# Patient Record
Sex: Male | Born: 1975 | Race: Black or African American | Hispanic: No | Smoking: Current every day smoker
Health system: Southern US, Community
[De-identification: ages and names within clinical notes are randomized; demographics above are authoritative.]

## PROBLEM LIST (undated history)

## (undated) DIAGNOSIS — R569 Unspecified convulsions: Secondary | ICD-10-CM

---

## 1998-09-21 ENCOUNTER — Emergency Department (HOSPITAL_COMMUNITY): Admission: EM | Admit: 1998-09-21 | Discharge: 1998-09-21 | Payer: Self-pay | Admitting: Emergency Medicine

## 1998-09-21 ENCOUNTER — Encounter: Payer: Self-pay | Admitting: Emergency Medicine

## 1998-12-11 ENCOUNTER — Emergency Department (HOSPITAL_COMMUNITY): Admission: EM | Admit: 1998-12-11 | Discharge: 1998-12-11 | Payer: Self-pay | Admitting: Emergency Medicine

## 1999-07-27 ENCOUNTER — Encounter: Payer: Self-pay | Admitting: Emergency Medicine

## 1999-07-27 ENCOUNTER — Emergency Department (HOSPITAL_COMMUNITY): Admission: EM | Admit: 1999-07-27 | Discharge: 1999-07-27 | Payer: Self-pay | Admitting: *Deleted

## 1999-09-23 ENCOUNTER — Emergency Department (HOSPITAL_COMMUNITY): Admission: EM | Admit: 1999-09-23 | Discharge: 1999-09-23 | Payer: Self-pay | Admitting: Emergency Medicine

## 1999-09-24 ENCOUNTER — Encounter: Payer: Self-pay | Admitting: Emergency Medicine

## 1999-09-24 ENCOUNTER — Ambulatory Visit (HOSPITAL_COMMUNITY): Admission: RE | Admit: 1999-09-24 | Discharge: 1999-09-24 | Payer: Self-pay | Admitting: Emergency Medicine

## 1999-09-27 ENCOUNTER — Emergency Department (HOSPITAL_COMMUNITY): Admission: EM | Admit: 1999-09-27 | Discharge: 1999-09-27 | Payer: Self-pay | Admitting: Emergency Medicine

## 1999-10-15 ENCOUNTER — Emergency Department (HOSPITAL_COMMUNITY): Admission: EM | Admit: 1999-10-15 | Discharge: 1999-10-16 | Payer: Self-pay | Admitting: Emergency Medicine

## 1999-10-23 ENCOUNTER — Emergency Department (HOSPITAL_COMMUNITY): Admission: EM | Admit: 1999-10-23 | Discharge: 1999-10-23 | Payer: Self-pay | Admitting: Emergency Medicine

## 1999-10-29 ENCOUNTER — Emergency Department (HOSPITAL_COMMUNITY): Admission: EM | Admit: 1999-10-29 | Discharge: 1999-10-29 | Payer: Self-pay | Admitting: *Deleted

## 1999-10-29 ENCOUNTER — Encounter: Payer: Self-pay | Admitting: Emergency Medicine

## 1999-11-04 ENCOUNTER — Emergency Department (HOSPITAL_COMMUNITY): Admission: EM | Admit: 1999-11-04 | Discharge: 1999-11-04 | Payer: Self-pay | Admitting: Emergency Medicine

## 1999-11-13 ENCOUNTER — Emergency Department (HOSPITAL_COMMUNITY): Admission: EM | Admit: 1999-11-13 | Discharge: 1999-11-13 | Payer: Self-pay | Admitting: Emergency Medicine

## 1999-11-13 ENCOUNTER — Encounter: Payer: Self-pay | Admitting: Emergency Medicine

## 1999-12-12 ENCOUNTER — Emergency Department (HOSPITAL_COMMUNITY): Admission: EM | Admit: 1999-12-12 | Discharge: 1999-12-12 | Payer: Self-pay | Admitting: Emergency Medicine

## 1999-12-12 ENCOUNTER — Encounter: Payer: Self-pay | Admitting: Emergency Medicine

## 2000-05-01 ENCOUNTER — Emergency Department (HOSPITAL_COMMUNITY): Admission: EM | Admit: 2000-05-01 | Discharge: 2000-05-01 | Payer: Self-pay | Admitting: *Deleted

## 2000-05-01 ENCOUNTER — Encounter: Payer: Self-pay | Admitting: *Deleted

## 2001-05-06 ENCOUNTER — Encounter: Payer: Self-pay | Admitting: Internal Medicine

## 2001-05-06 ENCOUNTER — Emergency Department (HOSPITAL_COMMUNITY): Admission: EM | Admit: 2001-05-06 | Discharge: 2001-05-06 | Payer: Self-pay | Admitting: Internal Medicine

## 2001-09-04 ENCOUNTER — Emergency Department (HOSPITAL_COMMUNITY): Admission: EM | Admit: 2001-09-04 | Discharge: 2001-09-04 | Payer: Self-pay | Admitting: Emergency Medicine

## 2001-09-08 ENCOUNTER — Emergency Department (HOSPITAL_COMMUNITY): Admission: EM | Admit: 2001-09-08 | Discharge: 2001-09-08 | Payer: Self-pay | Admitting: Emergency Medicine

## 2001-10-16 ENCOUNTER — Encounter: Payer: Self-pay | Admitting: Emergency Medicine

## 2001-10-16 ENCOUNTER — Emergency Department (HOSPITAL_COMMUNITY): Admission: EM | Admit: 2001-10-16 | Discharge: 2001-10-16 | Payer: Self-pay | Admitting: *Deleted

## 2001-11-10 ENCOUNTER — Emergency Department (HOSPITAL_COMMUNITY): Admission: EM | Admit: 2001-11-10 | Discharge: 2001-11-10 | Payer: Self-pay

## 2002-03-18 ENCOUNTER — Emergency Department (HOSPITAL_COMMUNITY): Admission: EM | Admit: 2002-03-18 | Discharge: 2002-03-18 | Payer: Self-pay

## 2002-04-30 ENCOUNTER — Emergency Department (HOSPITAL_COMMUNITY): Admission: EM | Admit: 2002-04-30 | Discharge: 2002-04-30 | Payer: Self-pay | Admitting: Emergency Medicine

## 2002-11-12 ENCOUNTER — Emergency Department (HOSPITAL_COMMUNITY): Admission: EM | Admit: 2002-11-12 | Discharge: 2002-11-12 | Payer: Self-pay | Admitting: *Deleted

## 2002-12-03 ENCOUNTER — Emergency Department (HOSPITAL_COMMUNITY): Admission: EM | Admit: 2002-12-03 | Discharge: 2002-12-03 | Payer: Self-pay | Admitting: Emergency Medicine

## 2003-06-19 ENCOUNTER — Emergency Department (HOSPITAL_COMMUNITY): Admission: EM | Admit: 2003-06-19 | Discharge: 2003-06-19 | Payer: Self-pay | Admitting: Emergency Medicine

## 2003-08-26 ENCOUNTER — Emergency Department (HOSPITAL_COMMUNITY): Admission: EM | Admit: 2003-08-26 | Discharge: 2003-08-26 | Payer: Self-pay | Admitting: Emergency Medicine

## 2003-10-27 ENCOUNTER — Emergency Department (HOSPITAL_COMMUNITY): Admission: AD | Admit: 2003-10-27 | Discharge: 2003-10-27 | Payer: Self-pay | Admitting: Family Medicine

## 2003-11-10 ENCOUNTER — Observation Stay (HOSPITAL_COMMUNITY): Admission: EM | Admit: 2003-11-10 | Discharge: 2003-11-10 | Payer: Self-pay | Admitting: Emergency Medicine

## 2003-11-22 ENCOUNTER — Emergency Department (HOSPITAL_COMMUNITY): Admission: EM | Admit: 2003-11-22 | Discharge: 2003-11-22 | Payer: Self-pay | Admitting: Emergency Medicine

## 2004-01-15 ENCOUNTER — Emergency Department (HOSPITAL_COMMUNITY): Admission: EM | Admit: 2004-01-15 | Discharge: 2004-01-16 | Payer: Self-pay | Admitting: Emergency Medicine

## 2004-01-15 ENCOUNTER — Emergency Department (HOSPITAL_COMMUNITY): Admission: EM | Admit: 2004-01-15 | Discharge: 2004-01-15 | Payer: Self-pay | Admitting: Family Medicine

## 2004-02-19 ENCOUNTER — Ambulatory Visit (HOSPITAL_COMMUNITY): Admission: RE | Admit: 2004-02-19 | Discharge: 2004-02-19 | Payer: Self-pay | Admitting: Gastroenterology

## 2004-02-19 ENCOUNTER — Encounter: Payer: Self-pay | Admitting: Gastroenterology

## 2004-04-14 ENCOUNTER — Emergency Department (HOSPITAL_COMMUNITY): Admission: EM | Admit: 2004-04-14 | Discharge: 2004-04-14 | Payer: Self-pay | Admitting: Emergency Medicine

## 2004-05-06 ENCOUNTER — Encounter: Payer: Self-pay | Admitting: Gastroenterology

## 2004-06-23 ENCOUNTER — Emergency Department (HOSPITAL_COMMUNITY): Admission: EM | Admit: 2004-06-23 | Discharge: 2004-06-23 | Payer: Self-pay | Admitting: Family Medicine

## 2005-03-24 ENCOUNTER — Emergency Department (HOSPITAL_COMMUNITY): Admission: EM | Admit: 2005-03-24 | Discharge: 2005-03-24 | Payer: Self-pay | Admitting: Emergency Medicine

## 2006-08-11 ENCOUNTER — Emergency Department (HOSPITAL_COMMUNITY): Admission: EM | Admit: 2006-08-11 | Discharge: 2006-08-11 | Payer: Self-pay | Admitting: Emergency Medicine

## 2006-10-08 ENCOUNTER — Emergency Department (HOSPITAL_COMMUNITY): Admission: EM | Admit: 2006-10-08 | Discharge: 2006-10-08 | Payer: Self-pay | Admitting: Emergency Medicine

## 2006-12-12 ENCOUNTER — Emergency Department (HOSPITAL_COMMUNITY): Admission: EM | Admit: 2006-12-12 | Discharge: 2006-12-12 | Payer: Self-pay | Admitting: Emergency Medicine

## 2007-02-24 ENCOUNTER — Emergency Department (HOSPITAL_COMMUNITY): Admission: EM | Admit: 2007-02-24 | Discharge: 2007-02-24 | Payer: Self-pay | Admitting: Emergency Medicine

## 2007-02-27 ENCOUNTER — Emergency Department (HOSPITAL_COMMUNITY): Admission: EM | Admit: 2007-02-27 | Discharge: 2007-02-27 | Payer: Self-pay | Admitting: *Deleted

## 2007-06-06 ENCOUNTER — Emergency Department (HOSPITAL_COMMUNITY): Admission: EM | Admit: 2007-06-06 | Discharge: 2007-06-06 | Payer: Self-pay | Admitting: Emergency Medicine

## 2008-08-24 ENCOUNTER — Emergency Department (HOSPITAL_COMMUNITY): Admission: EM | Admit: 2008-08-24 | Discharge: 2008-08-24 | Payer: Self-pay | Admitting: Emergency Medicine

## 2008-08-26 ENCOUNTER — Emergency Department (HOSPITAL_COMMUNITY): Admission: EM | Admit: 2008-08-26 | Discharge: 2008-08-26 | Payer: Self-pay | Admitting: Emergency Medicine

## 2008-08-26 ENCOUNTER — Encounter (INDEPENDENT_AMBULATORY_CARE_PROVIDER_SITE_OTHER): Payer: Self-pay | Admitting: *Deleted

## 2010-10-10 ENCOUNTER — Emergency Department (HOSPITAL_COMMUNITY)
Admission: EM | Admit: 2010-10-10 | Discharge: 2010-10-10 | Payer: Self-pay | Source: Home / Self Care | Admitting: Emergency Medicine

## 2010-10-10 ENCOUNTER — Encounter (INDEPENDENT_AMBULATORY_CARE_PROVIDER_SITE_OTHER): Payer: Self-pay | Admitting: *Deleted

## 2010-12-04 ENCOUNTER — Encounter (INDEPENDENT_AMBULATORY_CARE_PROVIDER_SITE_OTHER): Payer: Self-pay | Admitting: *Deleted

## 2010-12-11 NOTE — Letter (Signed)
Summary: New Patient letter  [facility]  [address]   [place], [place] [address]   Phone: [phone]  Fax: [phone]       12/04/2010 MRN: [id number]  Nicholas Medina [address] [place], [place]  [address]  Dear Mr. Nicholas Medina,  Welcome to the Gastroenterology Division at [facility].    You are scheduled to see Dr.  Russella Dar on 12/30/2010 at 10:15 on the 3rd floor at [facility], [address]  We ask that you try to arrive at our office 15 minutes prior to your appointment time to allow for check-in.  We would like you to complete the enclosed self-administered evaluation form prior to your visit and bring it with you on the day of your appointment.  We will review it with you.  Also, please bring a complete list of all your medications or, if you prefer, bring the medication bottles and we will list them.  Please bring your insurance card so that we may make a copy of it.  If your insurance requires a referral to see a specialist, please bring your referral form from your primary care physician.  Co-payments are due at the time of your visit and may be paid by cash, check or credit card.     Your office visit will consist of a consult with your physician (includes a physical exam), any laboratory testing he/she may order, scheduling of any necessary diagnostic testing (e.g. x-ray, ultrasound, CT-scan), and scheduling of a procedure (e.g. Endoscopy, Colonoscopy) if required.  Please allow enough time on your schedule to allow for any/all of these possibilities.    If you cannot keep your appointment, please call [phone] to cancel or reschedule prior to your appointment date.  This allows [place] the opportunity to schedule an appointment for another patient in need of care.  If you do not cancel or reschedule by 5 p.m. the business day prior to your appointment date, you will be charged a $50.00 late cancellation/no-show fee.    Thank you for choosing [facility] for your medical needs.  We appreciate the opportunity to care for you.  Please visit [place] at our website  to learn more about our practice.                     Sincerely,                                                             The Gastroenterology Division

## 2010-12-19 ENCOUNTER — Encounter (INDEPENDENT_AMBULATORY_CARE_PROVIDER_SITE_OTHER): Payer: Self-pay | Admitting: Internal Medicine

## 2010-12-19 ENCOUNTER — Encounter: Payer: Self-pay | Admitting: Internal Medicine

## 2010-12-19 DIAGNOSIS — F1410 Cocaine abuse, uncomplicated: Secondary | ICD-10-CM | POA: Insufficient documentation

## 2010-12-19 DIAGNOSIS — F1010 Alcohol abuse, uncomplicated: Secondary | ICD-10-CM | POA: Insufficient documentation

## 2010-12-19 LAB — CONVERTED CEMR LAB
ALT: 32 units/L (ref 0–53)
AST: 18 units/L (ref 0–37)
Albumin: 4.3 g/dL (ref 3.5–5.2)
Alkaline Phosphatase: 55 units/L (ref 39–117)
Basophils Relative: 0 % (ref 0–1)
Chloride: 104 meq/L (ref 96–112)
Eosinophils Absolute: 0.3 10*3/uL (ref 0.0–0.7)
LDL Cholesterol: 78 mg/dL (ref 0–99)
MCHC: 36 g/dL (ref 30.0–36.0)
MCV: 82.5 fL (ref 78.0–100.0)
Neutro Abs: 4.9 10*3/uL (ref 1.7–7.7)
Neutrophils Relative %: 63 % (ref 43–77)
Platelets: 201 10*3/uL (ref 150–400)
Potassium: 3.7 meq/L (ref 3.5–5.3)
Sodium: 142 meq/L (ref 135–145)
Total Protein: 6.8 g/dL (ref 6.0–8.3)
WBC: 7.7 10*3/uL (ref 4.0–10.5)

## 2010-12-24 ENCOUNTER — Encounter (INDEPENDENT_AMBULATORY_CARE_PROVIDER_SITE_OTHER): Payer: Self-pay | Admitting: Internal Medicine

## 2010-12-30 ENCOUNTER — Ambulatory Visit: Payer: Self-pay | Admitting: Gastroenterology

## 2010-12-31 ENCOUNTER — Emergency Department (HOSPITAL_COMMUNITY)
Admission: EM | Admit: 2010-12-31 | Discharge: 2010-12-31 | Disposition: A | Discharge status: Home / Self Care | Payer: Self-pay | Attending: Emergency Medicine | Admitting: Emergency Medicine

## 2010-12-31 DIAGNOSIS — J069 Acute upper respiratory infection, unspecified: Secondary | ICD-10-CM | POA: Insufficient documentation

## 2010-12-31 DIAGNOSIS — H9209 Otalgia, unspecified ear: Secondary | ICD-10-CM | POA: Insufficient documentation

## 2010-12-31 DIAGNOSIS — R059 Cough, unspecified: Secondary | ICD-10-CM | POA: Insufficient documentation

## 2010-12-31 DIAGNOSIS — R05 Cough: Secondary | ICD-10-CM | POA: Insufficient documentation

## 2010-12-31 DIAGNOSIS — A599 Trichomoniasis, unspecified: Secondary | ICD-10-CM | POA: Insufficient documentation

## 2010-12-31 NOTE — Assessment & Plan Note (Signed)
Summary: New pt. to estab.    Vital Signs:  Patient profile:   35 year old male Height:      70 inches Weight:      180.50 pounds BMI:     25.99 Temp:     98.4 degrees F oral Pulse rate:   86 / minute Pulse rhythm:   regular Resp:     16 per minute BP sitting:   118 / 56  (left arm) Cuff size:   regular  Vitals Entered By: Hale Drone CMA (December 19, 2010 2:31 PM) CC: Here to establish. Hx of cocaine. 2 months since last tried. Wants to be checked for STD's. Is Patient Diabetic? No Pain Assessment Patient in pain? no       Does patient need assistance? Functional Status Self care Ambulation Normal   CC:  Here to establish. Hx of cocaine. 2 months since last tried. Wants to be checked for STD's..  History of Present Illness: 35 yo male here to establish.  Concerns:  1.  Hx alcoholism:  has not had a drink in 2 months.  Going to [facility] counseling group and they recommended having labs checked regarding liver for this.  Margaretmary Dys, psychologist per pt, is his counselor.  Started drinking ETOH age 21 yo.  Has been up to a pint of gin daily--especially right after losing daughter 7 years ago at age 35 mos--SIDS per pt.  Hx of DWI in 2008. No hx of cirrhosis.  2.  Hx of Cocaine abuse:  snorted powder.  Started age 56.  Stopped 2 months ago.  Stopped because she just got his 78 yo daughter at home with him.  Never in prison for drug use.  Has tried MJ before, but does not have a history of regular use.  No hx of IV drug use.    3. STD request as above.  No symptoms to suggest an STD--no rash, genital lesions, pain or penile discharge.  Just in a relationship for over 1 year and would like to make sure he does not have anything.  X wife apparently with concern for having other partners in her grief after daughter died and that is what worries pt.  He has had no intimate exposure to her for 2 years.  He is okay to wait until returns for physical.  Current Medications (verified): 1)   None  Allergies (verified): No Known Drug Allergies  Past History:  Past Surgical History: 1.  2007:  wiring of fractured jaw.  Dr. Monia Pouch  Family History: Mother, 34:  Htn Father, died 29:  Lung cancer, pneumonia.  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  1/2 ppd Alcohol:  Hx of abuse.  Quit in 10/2010 Drugs:  snorted cocaine.  Quit 10/2010  Physical Exam  General:  Smells of tobacco smoke Eyes:  Anicteric sclera Mouth:  poor dentition and teeth missing.   Lungs:  Normal respiratory effort, chest expands symmetrically. Lungs are clear to auscultation, no  crackles or wheezes. Heart:  Normal rate and regular rhythm. S1 and S2 normal without gallop, murmur, click, rub or other extra sounds. Abdomen:  Bowel sounds positive,abdomen soft and non-tender without masses, organomegaly or hernias noted.   Impression & Recommendations:  Problem # 1:  ALCOHOL ABUSE (ICD-305.00) Abstinent for 2 months Orders: T-Comprehensive Metabolic Panel (16109-60454) T-CBC w/Diff (09811-91478) T-Lipid Profile (29562-13086)  Problem # 2:  COCAINE ABUSE (ICD-305.60) Abstinent for 2 months Orders: T-Comprehensive Metabolic Panel (57846-96295)  Problem # 3:  Preventive Health Care (ICD-V70.0) Tdap today Declined flu vaccine Will discuss options for tobacco cessation at next visit when has coverage for meds  Other Orders: TD Toxoids IM 7 YR + (28413) Admin 1st Vaccine (24401)  Patient Instructions: 1)  Follow  up with Dr. Delrae Alfred in 3- 4 months --CPE   Orders Added: 1)  T-Comprehensive Metabolic Panel [80053-22900] 2)  T-CBC w/Diff [02725-36644] 3)  T-Lipid Profile [80061-22930] 4)  New Patient Level II [99202] 5)  TD Toxoids IM 7 YR + [90714] 6)  Admin 1st Vaccine [03474]   Immunization History:  Influenza Immunization History:    Influenza:  refused (12/19/2010)  Immunizations Administered:  Tetanus Vaccine:    Vaccine Type: Td    Site: right deltoid    Mfr: Sanofi Pasteur    Dose: 0.5 ml    Route: IM    Given by: Hale Drone CMA    Exp. Date: 02/12/2011    Lot #: [id number]    VIS given: 09/26/08 version given December 19, 2010.  Not Administered:    Influenza Vaccine not given due to: declined   Immunization History:  Influenza Immunization History:    Influenza:  refused (12/19/2010)  Immunizations Administered:  Tetanus Vaccine:    Vaccine Type: Td    Site: right deltoid    Mfr: Sanofi Pasteur    Dose: 0.5 ml    Route: IM    Given by: Hale Drone CMA    Exp. Date: 02/12/2011    Lot #: [id number]    VIS given: 09/26/08 version given December 19, 2010.

## 2010-12-31 NOTE — Letter (Signed)
Summary: Lipid Letter  [facility]  [address] [place], [place] [address]   Phone: [phone]  Fax: [phone]    12/24/2010  Nicholas Medina [address] [place], [place]  [address]  Dear Nicholas Medina:  We have carefully reviewed your last lipid profile from 12/19/2010 and the results are noted below with a summary of recommendations for lipid management.    Cholesterol:       135     Goal: <200   HDL "good" Cholesterol:   38     Goal: >45   LDL "bad" Cholesterol:   78     Goal: <100   Triglycerides:       96     Goal: <150    Your cholesterol is pretty good.  I would like to see your "good"  cholesterol over 45, however.  Daily exercise and eating lots of veggies daily will help this.  Rest of labs were fine.    TLC Diet (Therapeutic Lifestyle Change): Saturated Fats & Transfatty acids should be kept < 7% of total calories ***Reduce Saturated Fats Polyunstaurated Fat can be up to 10% of total calories Monounsaturated Fat Fat can be up to 20% of total calories Total Fat should be no greater than 25-35% of total calories Carbohydrates should be 50-60% of total calories Protein should be approximately 15% of total calories Fiber should be at least 20-30 grams a day ***Increased fiber may help lower LDL Total Cholesterol should be < 200mg /day Consider adding plant stanol/sterols to diet (example: Benacol spread) ***A higher intake of unsaturated fat may reduce Triglycerides and Increase HDL    Adjunctive Measures (may lower LIPIDS and reduce risk of Heart Attack) include: Aerobic Exercise (20-30 minutes 3-4 times a week) Limit Alcohol Consumption Weight Reduction Aspirin 75-81 mg a day by mouth (if not allergic or contraindicated) Dietary Fiber 20-30 grams a day by mouth     Current Medications:  None If you have any questions, please call. We appreciate being able to work with you.   Sincerely,    [facility]

## 2010-12-31 NOTE — Assessment & Plan Note (Signed)
Summary: Gastroenterology  [place]   MR#:  [id number] Page #  NAME:  Nicholas Medina, Nicholas Medina    OFFICE NO:  [id number]  DATE:  05/06/04  DOB:  02-Sep-1976  Patient is seen in the emergency room by Blase Mess and Dr. Yancey Flemings on April 14, 2004.  At that time he had periumbilical left lower quadrant and left flank abdominal pain, which was described as a burning with a slight looseness to the stools.  He was treated with a course of Cipro, which did slightly improve his symptoms.  He has noted about 2 loose bowel movements a day for the past week or two.  His general bowel habits are one formed stool a day.  He also notes some gas pain and intermittent cramping in the periumbilical area of the left lower quadrant.  He had a history of terminal ileal thickening on CT scan and a small bowel series also showed some minimal thickening of terminal ileum.  He has been treated for possible Crohn's ileitis because of these findings.  He remains on Pentasa 1 g t.i.d. and he claims he has been compliant with this regimen.  Blood work done through the emergency room visit on April 14, 2004 was essentially unremarkable.  Abdominal films were normal.  He also has had significant reflux and heartburn symptoms several times a day for the past few weeks.    MEDICATIONS:  Pentasa 1 g t.i.d., Darvocet N 100 p.r.n., hydrocodone 5/500 p.r.n.  ALLERGIES:  Rash from Prevacid.  EXAM:  In no acute distress.  Weight 172 pounds.  Blood pressure 98/56.  Pulse 72 and regular.   Chest is clear to auscultation and percussion.  Cardiac:  Regular rate and rhythm without murmurs.  Abdomen is soft with minimal left lower quadrant tenderness to deep palpation.  No rebound or guarding.  No palpable organomegaly, masses, or hernias.  Normoactive bowel sounds.   ASSESSMENT AND PLAN:   1.  Gastroesophageal reflux disease.  Begin Protonix 40 mg p.o. q. a.m. along with standard antireflux measures.  2.  Possible Crohn's ileitis.  Possible irritable bowel  syndrome.  Trial of Robinul Forte 1/2 to 1 whole tablet b.i.d.  Return office visit in six weeks.  Ongoing followup with his primary physician at [facility].      Venita Lick. Russella Dar, M.D., F.A.C.G. [id number] D:  05/06/04; T:  ; Job [id number]

## 2011-01-03 ENCOUNTER — Emergency Department (HOSPITAL_COMMUNITY)
Admission: EM | Admit: 2011-01-03 | Discharge: 2011-01-03 | Disposition: A | Discharge status: Home / Self Care | Payer: Self-pay | Attending: Emergency Medicine | Admitting: Emergency Medicine

## 2011-01-03 DIAGNOSIS — J3489 Other specified disorders of nose and nasal sinuses: Secondary | ICD-10-CM | POA: Insufficient documentation

## 2011-01-03 DIAGNOSIS — J329 Chronic sinusitis, unspecified: Secondary | ICD-10-CM | POA: Insufficient documentation

## 2011-01-03 DIAGNOSIS — R51 Headache: Secondary | ICD-10-CM | POA: Insufficient documentation

## 2011-01-19 LAB — COMPREHENSIVE METABOLIC PANEL
ALT: 29 U/L (ref 0–53)
AST: 26 U/L (ref 0–37)
Alkaline Phosphatase: 62 U/L (ref 39–117)
CO2: 29 mEq/L (ref 19–32)
Calcium: 9.1 mg/dL (ref 8.4–10.5)
Chloride: 106 mEq/L (ref 96–112)
GFR calc Af Amer: >60 mL/min (ref >60)
GFR calc non Af Amer: >60 mL/min (ref >60)
Potassium: 3.6 mEq/L (ref 3.5–5.1)
Sodium: 141 mEq/L (ref 135–145)

## 2011-01-19 LAB — CBC
Hemoglobin: 16.1 g/dL (ref 13.0–17.0)
MCHC: 36.6 g/dL — ABNORMAL HIGH (ref 30.0–36.0)
RBC: 5.23 MIL/uL (ref 4.22–5.81)
WBC: 15.4 10*3/uL — ABNORMAL HIGH (ref 4.0–10.5)

## 2011-01-19 LAB — DIFFERENTIAL
Eosinophils Absolute: 0.2 10*3/uL (ref 0.0–0.7)
Eosinophils Relative: 1 % (ref 0–5)
Lymphs Abs: 1 10*3/uL (ref 0.7–4.0)
Monocytes Relative: 6 % (ref 3–12)

## 2011-01-19 LAB — LIPASE, BLOOD: Lipase: 29 U/L (ref 11–59)

## 2011-01-19 LAB — HEMOCCULT GUIAC POC 1CARD (OFFICE): Fecal Occult Bld: POSITIVE
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
## 2011-07-17 ENCOUNTER — Emergency Department (HOSPITAL_COMMUNITY)
Admission: EM | Admit: 2011-07-17 | Discharge: 2011-07-17 | Disposition: A | Discharge status: Home / Self Care | Payer: Self-pay | Attending: Emergency Medicine | Admitting: Emergency Medicine

## 2011-07-17 ENCOUNTER — Emergency Department (HOSPITAL_COMMUNITY): Payer: Self-pay

## 2011-07-17 DIAGNOSIS — R569 Unspecified convulsions: Secondary | ICD-10-CM | POA: Insufficient documentation

## 2011-07-17 DIAGNOSIS — Z0389 Encounter for observation for other suspected diseases and conditions ruled out: Secondary | ICD-10-CM | POA: Insufficient documentation

## 2011-07-17 LAB — RAPID URINE DRUG SCREEN, HOSP PERFORMED
Amphetamines: NOT DETECTED
Tetrahydrocannabinol: NOT DETECTED

## 2011-07-17 LAB — POCT I-STAT, CHEM 8
BUN: 10 mg/dL (ref 6–23)
Calcium, Ion: 1.22 mmol/L (ref 1.12–1.32)
Chloride: 105 mEq/L (ref 96–112)
Glucose, Bld: 112 mg/dL — ABNORMAL HIGH (ref 70–99)

## 2011-08-11 LAB — POCT I-STAT, CHEM 8
Calcium, Ion: 1.15
Chloride: 103
Glucose, Bld: 80
HCT: 48
TCO2: 28

## 2011-08-11 LAB — DIFFERENTIAL
Basophils Absolute: 0
Basophils Relative: 0
Basophils Relative: 0
Eosinophils Absolute: 0.2
Eosinophils Absolute: 0.4
Eosinophils Relative: 2
Lymphocytes Relative: 15
Monocytes Absolute: 1
Neutro Abs: 5.2
Neutrophils Relative %: 61

## 2011-08-11 LAB — CBC
MCHC: 33
MCV: 90
Platelets: 197
RBC: 5.24
WBC: 14.3 — ABNORMAL HIGH
WBC: 8.7

## 2011-08-11 LAB — BASIC METABOLIC PANEL
BUN: 6
Calcium: 9.2
Chloride: 101
Creatinine, Ser: 1.05

## 2011-08-11 LAB — LIPASE, BLOOD: Lipase: 19

## 2011-08-27 ENCOUNTER — Inpatient Hospital Stay (INDEPENDENT_AMBULATORY_CARE_PROVIDER_SITE_OTHER)
Admission: RE | Admit: 2011-08-27 | Discharge: 2011-08-27 | Disposition: A | Discharge status: Home / Self Care | Payer: Self-pay | Source: Ambulatory Visit | Attending: Emergency Medicine | Admitting: Emergency Medicine

## 2011-08-27 DIAGNOSIS — R109 Unspecified abdominal pain: Secondary | ICD-10-CM

## 2011-08-27 DIAGNOSIS — M545 Low back pain: Secondary | ICD-10-CM

## 2011-08-27 LAB — DIFFERENTIAL
Basophils Relative: 1 % (ref 0–1)
Eosinophils Absolute: 0.2 10*3/uL (ref 0.0–0.7)
Eosinophils Relative: 3 % (ref 0–5)
Lymphs Abs: 2.4 10*3/uL (ref 0.7–4.0)
Monocytes Absolute: 0.6 10*3/uL (ref 0.1–1.0)
Neutro Abs: 4.3 10*3/uL (ref 1.7–7.7)

## 2011-08-27 LAB — COMPREHENSIVE METABOLIC PANEL
ALT: 25 U/L (ref 0–53)
AST: 19 U/L (ref 0–37)
CO2: 27 mEq/L (ref 19–32)
Calcium: 9.3 mg/dL (ref 8.4–10.5)
Creatinine, Ser: 0.93 mg/dL (ref 0.50–1.35)
GFR calc Af Amer: >90 mL/min (ref >90)
GFR calc non Af Amer: >90 mL/min (ref >90)
Sodium: 139 mEq/L (ref 135–145)
Total Protein: 6.9 g/dL (ref 6.0–8.3)

## 2011-08-27 LAB — CBC
MCH: 29.9 pg (ref 26.0–34.0)
MCHC: 36.9 g/dL — ABNORMAL HIGH (ref 30.0–36.0)
MCV: 80.9 fL (ref 78.0–100.0)
Platelets: 174 10*3/uL (ref 150–400)

## 2011-08-27 LAB — POCT URINALYSIS DIP (DEVICE)
Glucose, UA: NEGATIVE mg/dL
Protein, ur: NEGATIVE mg/dL
Urobilinogen, UA: 2 mg/dL — ABNORMAL HIGH (ref 0.0–1.0)

## 2011-08-27 LAB — RAPID URINE DRUG SCREEN, HOSP PERFORMED
Barbiturates: NOT DETECTED
Benzodiazepines: NOT DETECTED
Cocaine: POSITIVE — AB
Opiates: NOT DETECTED
Tetrahydrocannabinol: NOT DETECTED

## 2012-02-26 ENCOUNTER — Emergency Department (HOSPITAL_COMMUNITY)
Admission: EM | Admit: 2012-02-26 | Discharge: 2012-02-26 | Disposition: A | Discharge status: Home / Self Care | Payer: Self-pay | Attending: Emergency Medicine | Admitting: Emergency Medicine

## 2012-02-26 ENCOUNTER — Encounter (HOSPITAL_COMMUNITY): Payer: Self-pay | Admitting: Emergency Medicine

## 2012-02-26 ENCOUNTER — Emergency Department (HOSPITAL_COMMUNITY): Payer: Self-pay

## 2012-02-26 DIAGNOSIS — S40019A Contusion of unspecified shoulder, initial encounter: Secondary | ICD-10-CM | POA: Insufficient documentation

## 2012-02-26 DIAGNOSIS — S40011A Contusion of right shoulder, initial encounter: Secondary | ICD-10-CM

## 2012-02-26 DIAGNOSIS — Y92009 Unspecified place in unspecified non-institutional (private) residence as the place of occurrence of the external cause: Secondary | ICD-10-CM | POA: Insufficient documentation

## 2012-02-26 DIAGNOSIS — F1010 Alcohol abuse, uncomplicated: Secondary | ICD-10-CM | POA: Insufficient documentation

## 2012-02-26 DIAGNOSIS — I440 Atrioventricular block, first degree: Secondary | ICD-10-CM | POA: Insufficient documentation

## 2012-02-26 DIAGNOSIS — F1210 Cannabis abuse, uncomplicated: Secondary | ICD-10-CM | POA: Insufficient documentation

## 2012-02-26 DIAGNOSIS — W1809XA Striking against other object with subsequent fall, initial encounter: Secondary | ICD-10-CM | POA: Insufficient documentation

## 2012-02-26 DIAGNOSIS — F1910 Other psychoactive substance abuse, uncomplicated: Secondary | ICD-10-CM

## 2012-02-26 DIAGNOSIS — R55 Syncope and collapse: Secondary | ICD-10-CM | POA: Insufficient documentation

## 2012-02-26 DIAGNOSIS — F1410 Cocaine abuse, uncomplicated: Secondary | ICD-10-CM | POA: Insufficient documentation

## 2012-02-26 LAB — CBC
HCT: 40.3 % (ref 39.0–52.0)
Hemoglobin: 14.3 g/dL (ref 13.0–17.0)
MCH: 29.4 pg (ref 26.0–34.0)
MCHC: 35.5 g/dL (ref 30.0–36.0)
RDW: 13.7 % (ref 11.5–15.5)

## 2012-02-26 LAB — BASIC METABOLIC PANEL
BUN: 11 mg/dL (ref 6–23)
Calcium: 8.8 mg/dL (ref 8.4–10.5)
Creatinine, Ser: 0.91 mg/dL (ref 0.50–1.35)
GFR calc Af Amer: >90 mL/min (ref >90)
GFR calc non Af Amer: >90 mL/min (ref >90)
Glucose, Bld: 91 mg/dL (ref 70–99)

## 2012-02-26 LAB — RAPID URINE DRUG SCREEN, HOSP PERFORMED
Barbiturates: NOT DETECTED
Cocaine: POSITIVE — AB
Tetrahydrocannabinol: NOT DETECTED

## 2012-02-26 LAB — URINALYSIS, ROUTINE W REFLEX MICROSCOPIC
Bilirubin Urine: NEGATIVE
Hgb urine dipstick: NEGATIVE
Ketones, ur: NEGATIVE mg/dL
Nitrite: NEGATIVE
Protein, ur: NEGATIVE mg/dL
Urobilinogen, UA: 0.2 mg/dL (ref 0.0–1.0)

## 2012-02-26 MED ORDER — NAPROXEN 500 MG PO TABS: 500.0000 mg | ORAL_TABLET | Freq: Two times a day (BID) | ORAL | Status: DC

## 2012-02-26 NOTE — Discharge Instructions (Signed)
Ice to the area will help with swelling and pain. Take medications as prescribed. Follow up with your doctor for recheck in 2-3 days. If you do not have a doctor, followup with one of the numbers listed. Return to the emergency department for worsening condition or new concerning symptoms   RESOURCE GUIDE  Dental Problems  Patients with Medicaid: [facility] [phone]                                                                   [phone] [address] Phone:  [phone]                                                                             Phone:  [phone]  If unable to pay or uninsured, contact:  [facility] or [facility]. to become qualified for the adult dental clinic.  Chronic Pain Problems Contact [facility]  [phone] Patients need to be referred by their primary care doctor.  Insufficient Money for Medicine Contact [employer]:  call "211" or [facility] (509)545-8584.  No Primary Care Doctor Call [facility]  947-176-0376 Other agencies that provide inexpensive medical care    [facility]  [phone]    [facility]  [phone]    [facility]  [phone]    [facility]  [phone]    [facility]  [phone]    [facility]  [phone]  [facility]  [phone] [facility]  [phone] [facility] Mental Health   [phone] (emergency services [phone])  Abuse/Neglect [facility] Child Abuse Hotline (929)486-3698 [facility] Child Abuse Hotline (575)101-8618 (After Hours)  Emergency Shelter [facility] (514) 592-7621  Maternity Homes [facility] [place] [facility] 905-160-3965 [facility] (938) 100-2678  MRSA Hotline #:   251-346-2776  [facility] Resources  [facility]     [employer]                          [facility] Dept. [address] [place]                        [address]          [address] [place] [address]  [address]  Cristobal Goldmann Phone:  [phone]                                     Phone:  [phone]                   Phone:  [phone]  [facility] Phone:  ([phone]  [facility] Child Abuse Hotline 715-012-3066 (604)235-8640 (After Hours)       *free text    If you develop symptoms of Shortness of Breath, Chest Pain, Swelling of lips, mouth or tongue or if your condition becomes worse with any new symptoms, see your doctor or return to the Emergency Department for immediate care. Emergency services are not intended to be a substitute for comprehensive medical attention.  Please contact your doctor for follow up if not improving as expected.   Call your doctor in 5-7 days or as directed if there is no improvement.   Community Resources: *IF YOU ARE IN IMMEDIATE DANGER CALL 911!  Abuse/Neglect:  Family Services Crisis Hotline [facility]): (620) 501-8204 [facility]): 267-315-9516  After hours, holidays and weekends: 808-648-4045 National Domestic Violence Hotline: 801-251-0121  Mental Health: [facility]: Drucie Ip: 7606023847  Health Clinics:  [facility]): [phone] Monday - Friday 8 AM - 9 PM, Saturday and Sunday 10 AM - 9 PM  [facility] [place]: [phone] Monday - Friday 8 AM - 5 PM  [facility]  [address]: [phone] Monday- Friday 8:30 AM - 5:30 PM, Sat 9 AM - 1 PM  24 HR [place] [facility] [address]: [phone] [facility] [address]: [phone] [facility] [address]: [phone]  24 HR [facility]: [address] [phone]  Contusion A contusion is a deep bruise. Contusions are the result of an injury that caused bleeding under the skin. The contusion may turn blue, purple, or yellow. Minor injuries will give you a painless contusion, but more severe contusions may stay painful and swollen for a few weeks.  CAUSES  A contusion is usually caused by a blow, trauma, or direct force to an area of the body. SYMPTOMS   Swelling and redness of the injured area.   Bruising of the injured area.   Tenderness and soreness of the injured area.   Pain.  DIAGNOSIS  The diagnosis can be made by taking a history and physical exam. An X-ray, CT scan, or MRI may be needed to determine if there were any associated injuries, such as fractures. TREATMENT  Specific treatment will depend on what area of the body was injured. In general, the best treatment for a contusion is resting, icing, elevating, and applying cold compresses to the injured area. Over-the-counter medicines may also be recommended for pain control. Ask your caregiver what the best treatment is for your contusion. HOME CARE INSTRUCTIONS   Put ice on the injured area.   Put ice in a plastic bag.   Place a towel between your skin and the bag.   Leave the ice on for 15 to 20 minutes, 3 to 4 times a day.   Only take  over-the-counter or prescription medicines for pain, discomfort, or fever as directed by your caregiver. Your caregiver may recommend avoiding anti-inflammatory medicines (aspirin, ibuprofen, and naproxen) for 48 hours because these medicines may increase bruising.   Rest the injured area.   If possible, elevate the injured area to reduce swelling.  SEEK IMMEDIATE MEDICAL CARE IF:   You have increased bruising or swelling.   You have pain that is getting worse.   Your swelling or pain is not relieved with medicines.  MAKE SURE YOU:   Understand these instructions.   Will watch your condition.   Will get help right away if you  are not doing well or get worse.  Document Released: 08/05/2005 Document Revised: 10/15/2011 Document Reviewed: 08/31/2011 [facility] Patient Information 2012 [place], [place].  Syncope Syncope (fainting) is a sudden, short loss of consciousness. People normally fall to the ground when they faint. Recovery is often fast. HOME CARE  Do not drive or use machines. Wait until your doctor says it is safe to do so.   If you have diabetes, check your blood sugar. If it is low (below 70), you need to drink or eat something sweet. If over 300, call your doctor.   If you have a blood pressure machine at home, take your blood pressure. If the top number is below 100 or above 170, call your doctor.   Lie down until you feel normal.   Drink extra fluids (water, juice, soup).  GET HELP RIGHT AWAY IF:   You pass out (faint) when sitting or lying down. Do not drive. Call your local emergency services (911 in [place] if no one is there to help you.   There is chest pain.   You feel sick to your stomach (nauseous) or keep throwing up (vomiting).   You have very bad belly (abdominal) pain.   You feel your heartbeat is fast or not normal.   You lose feeling in some part of the body.   You cannot move your arms or legs.   You cannot talk well and get confused.   You feel weak or cannot see well.   You get sweaty and feel lightheaded.  MAKE SURE YOU:   Understand these instructions.   Will watch your condition.   Will get help right away if you are not doing well or get worse.  Document Released: 04/13/2008 Document Revised: 10/15/2011 Document Reviewed: 04/13/2008 [facility] Patient Information 2012 [place], [place].

## 2012-02-26 NOTE — ED Notes (Signed)
Pt presented to the ER with c/o shoulder and HA pain, states that he woke up to use bathroom and there after found himself on the floor. Pt states that he must of hit the sink while fell since at this time he is c/o multiple pain sites. Pt also reports CP- aching.

## 2012-02-26 NOTE — ED Provider Notes (Signed)
History     CSN: [id number]  Arrival date & time 02/26/12  0213   First MD Initiated Contact with Patient 02/26/12 [phone]      Chief Complaint  Patient presents with  . Loss of Consciousness    (Consider location/radiation/quality/duration/timing/severity/associated sxs/prior treatment) HPI 36 year old male presents to emergency room with reported syncope. Patient reports he got to the bathroom all night and then fell hitting his head and right shoulder. Patient reports he had been drinking tonight, also using cocaine and smoking marijuana. He denies drinking more than he normally does. Patient complaining of pain to the right side of his head and to a shoulder. No other injuries reported. No prior history of syncope. History reviewed. No pertinent past medical history.  History reviewed. No pertinent past surgical history.  History reviewed. No pertinent family history.  History  Substance Use Topics  . Smoking status: Current Everyday Smoker  . Smokeless tobacco: Not on file  . Alcohol Use: Yes      Review of Systems  All other systems reviewed and are negative.    Allergies  Review of patient's allergies indicates no known allergies.  Home Medications  No current outpatient prescriptions on file.  BP 113/75  Pulse 90  Temp(Src) 98.2 F (36.8 C) (Oral)  Resp 16  Ht 5\' 11"  (1.803 m)  Wt 182 lb (82.555 kg)  BMI 25.38 kg/m2  SpO2 99%  Physical Exam  Nursing note and vitals reviewed. Constitutional: He is oriented to person, place, and time. He appears well-developed and well-nourished.  HENT:  Head: Normocephalic and atraumatic.  Right Ear: External ear normal.  Left Ear: External ear normal.  Nose: Nose normal.  Mouth/Throat: Oropharynx is clear and moist.  Eyes: Conjunctivae and EOM are normal. Pupils are equal, round, and reactive to light.  Neck: Normal range of motion. Neck supple. No JVD present. No tracheal deviation present. No thyromegaly present.    Cardiovascular: Normal rate, regular rhythm, normal heart sounds and intact distal pulses.  Exam reveals no gallop and no friction rub.   No murmur heard. Pulmonary/Chest: Effort normal and breath sounds normal. No stridor. No respiratory distress. He has no wheezes. He has no rales. He exhibits no tenderness.  Abdominal: Soft. Bowel sounds are normal. He exhibits no distension and no mass. There is no tenderness. There is no rebound and no guarding.  Musculoskeletal: Normal range of motion. He exhibits tenderness (contusion noted to right lateral anterior shoulder no crepitus no limited range of motion). He exhibits no edema.  Lymphadenopathy:    He has no cervical adenopathy.  Neurological: He is alert and oriented to person, place, and time. He has normal reflexes. No cranial nerve deficit. He exhibits normal muscle tone. Coordination normal.  Skin: Skin is dry. No rash noted. No erythema. No pallor.  Psychiatric: He has a normal mood and affect. His behavior is normal. Judgment and thought content normal.    ED Course  Procedures (including critical care time)   Labs Reviewed  CBC  BASIC METABOLIC PANEL  URINALYSIS, ROUTINE W REFLEX MICROSCOPIC  URINE RAPID DRUG SCREEN (HOSP PERFORMED)   Ct Head Wo Contrast  02/26/2012  *RADIOLOGY REPORT*  Clinical Data: Syncope; hit posterior head on bathroom sink. Posterior headache.  CT HEAD WITHOUT CONTRAST  Technique:  Contiguous axial images were obtained from the base of the skull through the vertex without contrast.  Comparison: CT of the head performed 07/17/2011  Findings: There is no evidence of acute infarction, mass lesion,  or intra- or extra-axial hemorrhage on CT.  The posterior fossa, including the cerebellum, brainstem and fourth ventricle, is within normal limits.  The third and lateral ventricles, and basal ganglia are unremarkable in appearance.  The cerebral hemispheres are symmetric in appearance, with normal gray- white  differentiation.  No mass effect or midline shift is seen.  There is no evidence of fracture; visualized osseous structures are unremarkable in appearance.  The visualized portions of the orbits are within normal limits.  The paranasal sinuses and mastoid air cells are well-aerated.  No significant soft tissue abnormalities are seen.  IMPRESSION: No evidence of traumatic intracranial injury or fracture.  Original Report Authenticated By: Tonia Ghent, M.D.    Date: 02/26/2012  Rate: 60  Rhythm: normal sinus rhythm  QRS Axis: normal  Intervals: PR prolonged  ST/T Wave abnormalities: normal  Conduction Disutrbances:first-degree A-V block   Narrative Interpretation:   Old EKG Reviewed: unchanged    1. Syncope   2. Polysubstance abuse   3. Contusion of right shoulder       MDM  36 year old male with syncope after polysubstance abuse. Head CT negative, lab work unremarkable. EKG without significant findings. Will discharge home        Olivia Mackie, MD 02/26/12 [phone]

## 2012-02-26 NOTE — ED Notes (Signed)
Pt states he "passed out and hit his head"  He felt dizzy and laid down and it hurt and he hit it "pretty Good"

## 2012-10-23 ENCOUNTER — Emergency Department (HOSPITAL_COMMUNITY)
Admission: EM | Admit: 2012-10-23 | Discharge: 2012-10-23 | Disposition: A | Discharge status: Home / Self Care | Payer: Self-pay | Attending: Emergency Medicine | Admitting: Emergency Medicine

## 2012-10-23 ENCOUNTER — Encounter (HOSPITAL_COMMUNITY): Payer: Self-pay | Admitting: Emergency Medicine

## 2012-10-23 DIAGNOSIS — R109 Unspecified abdominal pain: Secondary | ICD-10-CM

## 2012-10-23 DIAGNOSIS — R1033 Periumbilical pain: Secondary | ICD-10-CM | POA: Insufficient documentation

## 2012-10-23 DIAGNOSIS — R110 Nausea: Secondary | ICD-10-CM | POA: Insufficient documentation

## 2012-10-23 DIAGNOSIS — F17200 Nicotine dependence, unspecified, uncomplicated: Secondary | ICD-10-CM | POA: Insufficient documentation

## 2012-10-23 LAB — CBC WITH DIFFERENTIAL/PLATELET
Basophils Relative: 0 % (ref 0–1)
Hemoglobin: 14.7 g/dL (ref 13.0–17.0)
Lymphocytes Relative: 27 % (ref 12–46)
MCHC: 35.3 g/dL (ref 30.0–36.0)
Monocytes Relative: 10 % (ref 3–12)
Neutro Abs: 6 10*3/uL (ref 1.7–7.7)
Neutrophils Relative %: 58 % (ref 43–77)
RBC: 5.1 MIL/uL (ref 4.22–5.81)
WBC: 10.3 10*3/uL (ref 4.0–10.5)

## 2012-10-23 LAB — COMPREHENSIVE METABOLIC PANEL
ALT: 17 U/L (ref 0–53)
Alkaline Phosphatase: 61 U/L (ref 39–117)
BUN: 10 mg/dL (ref 6–23)
CO2: 25 mEq/L (ref 19–32)
GFR calc Af Amer: >90 mL/min (ref >90)
GFR calc non Af Amer: >90 mL/min (ref >90)
Glucose, Bld: 100 mg/dL — ABNORMAL HIGH (ref 70–99)
Potassium: 3.7 mEq/L (ref 3.5–5.1)
Sodium: 139 mEq/L (ref 135–145)

## 2012-10-23 LAB — URINALYSIS, ROUTINE W REFLEX MICROSCOPIC
Ketones, ur: NEGATIVE mg/dL
Protein, ur: NEGATIVE mg/dL
Urobilinogen, UA: 0.2 mg/dL (ref 0.0–1.0)

## 2012-10-23 LAB — URINE MICROSCOPIC-ADD ON

## 2012-10-23 MED ORDER — MORPHINE SULFATE 4 MG/ML IJ SOLN
4.0000 mg | Freq: Once | INTRAMUSCULAR | Status: AC
  Administered 2012-10-23: 4 mg via INTRAVENOUS
  Filled 2012-10-23: qty 1

## 2012-10-23 MED ORDER — HYDROCODONE-ACETAMINOPHEN 5-325 MG PO TABS: ORAL_TABLET | ORAL | Status: DC

## 2012-10-23 MED ORDER — SODIUM CHLORIDE 0.9 % IV BOLUS (SEPSIS)
1000.0000 mL | Freq: Once | INTRAVENOUS | Status: AC
  Administered 2012-10-23: 1000 mL via INTRAVENOUS

## 2012-10-23 MED ORDER — ONDANSETRON HCL 4 MG/2ML IJ SOLN
4.0000 mg | Freq: Once | INTRAMUSCULAR | Status: AC
  Administered 2012-10-23: 4 mg via INTRAVENOUS
  Filled 2012-10-23: qty 2

## 2012-10-23 MED ORDER — SODIUM CHLORIDE 0.9 % IV BOLUS (SEPSIS)
500.0000 mL | Freq: Once | INTRAVENOUS | Status: AC
  Administered 2012-10-23: 500 mL via INTRAVENOUS

## 2012-10-23 MED ORDER — ONDANSETRON 8 MG PO TBDP: 8.0000 mg | ORAL_TABLET | Freq: Three times a day (TID) | ORAL | Status: DC | PRN

## 2012-10-23 NOTE — ED Provider Notes (Signed)
History     CSN: [id number]  Arrival date & time 10/23/12  4696   First MD Initiated Contact with Patient 10/23/12 0600      Chief Complaint  Patient presents with  . Abdominal Pain    (Consider location/radiation/quality/duration/timing/severity/associated sxs/prior treatment) HPI Comments: Patient with no past abdominal surgeries -- presents with sharp, intermittent, periumbilical pain, non-radiating for the past 2 days. IT has been associated with nausea, watery non-bloody diarrhea. It has not been associated with fever, CP, SOB, urinary symptoms. Patient denies recent ETOH use, drug use, heavy NSAID use. Has recently been around other people with 'stomach flu' but his symptoms lasted longer. Onset acute. Course intermittent. No treatments PTA. Nothing makes symptoms better or worse.   Patient is a 36 y.o. male presenting with abdominal pain. The history is provided by the patient.  Abdominal Pain The primary symptoms of the illness include abdominal pain and nausea. The primary symptoms of the illness do not include fever, vomiting, diarrhea or dysuria.  Symptoms associated with the illness do not include hematuria.    History reviewed. No pertinent past medical history.  History reviewed. No pertinent past surgical history.  No family history on file.  History  Substance Use Topics  . Smoking status: Current Every Day Smoker -- 0.5 packs/day  . Smokeless tobacco: Not on file  . Alcohol Use: Yes      Review of Systems  Constitutional: Negative for fever.  HENT: Negative for sore throat and rhinorrhea.   Eyes: Negative for redness.  Respiratory: Negative for cough.   Cardiovascular: Negative for chest pain.  Gastrointestinal: Positive for nausea and abdominal pain. Negative for vomiting, diarrhea and blood in stool.  Genitourinary: Negative for dysuria and hematuria.  Musculoskeletal: Negative for myalgias.  Skin: Negative for rash.  Neurological: Negative for  headaches.    Allergies  Review of patient's allergies indicates no known allergies.  Home Medications   Current Outpatient Rx  Name  Route  Sig  Dispense  Refill  . HYDROCODONE-ACETAMINOPHEN 5-325 MG PO TABS      Take 1-2 tablets every 6 hours as needed for severe pain   12 tablet   0   . ONDANSETRON 8 MG PO TBDP   Oral   Take 1 tablet (8 mg total) by mouth every 8 (eight) hours as needed for nausea.   6 tablet   0     BP 97/62  Pulse 84  Temp 98 F (36.7 C) (Oral)  SpO2 100%  Physical Exam  Nursing note and vitals reviewed. Constitutional: He appears well-developed and well-nourished.  HENT:  Head: Normocephalic and atraumatic.  Eyes: Conjunctivae normal are normal. Right eye exhibits no discharge. Left eye exhibits no discharge.  Neck: Normal range of motion. Neck supple.  Cardiovascular: Normal rate, regular rhythm and normal heart sounds.   Pulmonary/Chest: Effort normal and breath sounds normal.  Abdominal: Soft. Bowel sounds are decreased. There is tenderness in the periumbilical area. There is no rigidity, no rebound, no guarding, no CVA tenderness and negative Murphy's sign.  Neurological: He is alert.  Skin: Skin is warm and dry.  Psychiatric: He has a normal mood and affect.    ED Course  Procedures (including critical care time)  Labs Reviewed  COMPREHENSIVE METABOLIC PANEL - Abnormal; Notable for the following:    Glucose, Bld 100 (*)     All other components within normal limits  URINALYSIS, ROUTINE W REFLEX MICROSCOPIC - Abnormal; Notable for the following:  Color, Urine AMBER (*)  BIOCHEMICALS MAY BE AFFECTED BY COLOR   APPearance CLOUDY (*)     Specific Gravity, Urine 1.036 (*)     Hgb urine dipstick SMALL (*)     Bilirubin Urine SMALL (*)     All other components within normal limits  URINE MICROSCOPIC-ADD ON - Abnormal; Notable for the following:    Squamous Epithelial / LPF FEW (*)     Bacteria, UA FEW (*)     All other components  within normal limits  CBC WITH DIFFERENTIAL  LIPASE, BLOOD   No results found.   1. Abdominal pain     6:10 AM Patient seen and examined. Work-up initiated. Medications ordered.   Vital signs reviewed and are as follows: Filed Vitals:   10/23/12 0544  BP: 97/62  Pulse: 84  Temp: 98 F (36.7 C)   7:45 AM Pt d/w Dr. Hyacinth Meeker. Pt informed of results. Will treat symptomatically.   Re-exam: abd soft, NT. No localized tenderness. Patient appears well and is feeling a little better.   The patient was urged to return to the Emergency Department immediately with worsening of current symptoms, worsening abdominal pain, persistent vomiting, blood noted in stools, fever, or any other concerns. The patient verbalized understanding.      MDM  Patient with abdominal pain, diarrhea. He has minimal tenderness on exam here. Pain is non-localized. It is crampy. Labs are reassuring. At this point I do not suspect gallbladder dx, appendicitis, colitis. He has had sick contacts with 'the stomach flu'. Mild dehydration treated in ED. Patient otherwise appears well and is appropriate for outpt treatment of symptoms. Strict return instructions given.        Renne Crigler, [place] 10/23/12 [phone]

## 2012-10-23 NOTE — ED Provider Notes (Signed)
Medical screening examination/treatment/procedure(s) were performed by non-physician practitioner and as supervising physician I was immediately available for consultation/collaboration.    Vida Roller, MD 10/23/12 [phone]

## 2012-10-23 NOTE — ED Notes (Signed)
Pt started having stabbing pain in his mid abdomen x 2 days. States 10/10 pain, but pain is intermittent. Pt denies N/V. Pt is having diarrhea, 3 loose watery stools in the last 24 hours.

## 2013-08-19 ENCOUNTER — Emergency Department (HOSPITAL_COMMUNITY): Payer: Self-pay

## 2013-08-19 ENCOUNTER — Encounter (HOSPITAL_COMMUNITY): Payer: Self-pay | Admitting: Emergency Medicine

## 2013-08-19 ENCOUNTER — Emergency Department (HOSPITAL_COMMUNITY)
Admission: EM | Admit: 2013-08-19 | Discharge: 2013-08-19 | Disposition: A | Discharge status: Home / Self Care | Payer: Self-pay | Attending: Emergency Medicine | Admitting: Emergency Medicine

## 2013-08-19 DIAGNOSIS — R5381 Other malaise: Secondary | ICD-10-CM | POA: Insufficient documentation

## 2013-08-19 DIAGNOSIS — F17200 Nicotine dependence, unspecified, uncomplicated: Secondary | ICD-10-CM | POA: Insufficient documentation

## 2013-08-19 DIAGNOSIS — F1010 Alcohol abuse, uncomplicated: Secondary | ICD-10-CM | POA: Insufficient documentation

## 2013-08-19 LAB — COMPREHENSIVE METABOLIC PANEL
ALT: 24 U/L (ref 0–53)
AST: 26 U/L (ref 0–37)
CO2: 24 mEq/L (ref 19–32)
Chloride: 103 mEq/L (ref 96–112)
Creatinine, Ser: 1.01 mg/dL (ref 0.50–1.35)
GFR calc non Af Amer: >90 mL/min (ref >90)
Total Bilirubin: 0.5 mg/dL (ref 0.3–1.2)

## 2013-08-19 LAB — CBC WITH DIFFERENTIAL/PLATELET
Basophils Absolute: 0 10*3/uL (ref 0.0–0.1)
HCT: 39.5 % (ref 39.0–52.0)
Hemoglobin: 14.5 g/dL (ref 13.0–17.0)
Lymphocytes Relative: 36 % (ref 12–46)
Monocytes Absolute: 0.7 10*3/uL (ref 0.1–1.0)
Neutro Abs: 4.6 10*3/uL (ref 1.7–7.7)
RBC: 4.9 MIL/uL (ref 4.22–5.81)
RDW: 13.8 % (ref 11.5–15.5)
WBC: 8.5 10*3/uL (ref 4.0–10.5)

## 2013-08-19 NOTE — ED Notes (Signed)
Pt not cooperating to do NIHSS

## 2013-08-19 NOTE — ED Notes (Signed)
Patient transported to CT 

## 2013-08-19 NOTE — ED Notes (Signed)
Pt is not being cooperative to do NIHSS.

## 2013-08-19 NOTE — ED Notes (Signed)
Pt ambulated to the bathroom with no difficulties.

## 2013-08-19 NOTE — ED Notes (Signed)
Pt sleeping. 

## 2013-08-19 NOTE — ED Notes (Signed)
Patient Returned from CT.

## 2013-08-19 NOTE — ED Notes (Signed)
Pt reported to EMS that he was awake all night drinking but can not tell how much he drank . Pt called EMS because he had a head ache with pain 10/10. PT also reports Lt sided weakness.

## 2013-08-19 NOTE — ED Provider Notes (Addendum)
CSN: [id number]     Arrival date & time 08/19/13  8295 History   First MD Initiated Contact with Patient 08/19/13 [phone]     Chief Complaint  Patient presents with  . Headache   (Consider location/radiation/quality/duration/timing/severity/associated sxs/prior Treatment) Patient is a 37 y.o. male presenting with headaches. The history is provided by the patient and the EMS personnel. The history is limited by the condition of the patient.  Headache Location: unknown.  pt will not speak. only grumbles when asked questions. Associated symptoms comment:  Per EMS pt states he has been drinking all night and does acknowledge to me that he has been drinking  But unclear how much.  Also he reported to EMS left sided weakness.     No past medical history on file. No past surgical history on file. No family history on file. History  Substance Use Topics  . Smoking status: Current Every Day Smoker -- 0.50 packs/day  . Smokeless tobacco: Not on file  . Alcohol Use: Yes    Review of Systems  Unable to perform ROS Neurological: Positive for headaches.    Allergies  Review of patient's allergies indicates no known allergies.  Home Medications  No current outpatient prescriptions on file. BP 138/76  Pulse 83  Temp(Src) 97.9 F (36.6 C) (Oral)  Ht 6' (1.829 m)  Wt 185 lb (83.915 kg)  BMI 25.08 kg/m2  SpO2 99% Physical Exam  Nursing note and vitals reviewed. Constitutional: He is oriented to person, place, and time. He appears well-developed and well-nourished. No distress.  HENT:  Head: Normocephalic and atraumatic.  Mouth/Throat: Oropharynx is clear and moist.  Eyes: Conjunctivae and EOM are normal. Pupils are equal, round, and reactive to light.  Pupils reactive and normal  Neck: Normal range of motion. Neck supple. No spinous process tenderness and no muscular tenderness present.  Cardiovascular: Normal rate, regular rhythm and intact distal pulses.   No murmur  heard. Pulmonary/Chest: Effort normal and breath sounds normal. No respiratory distress. He has no wheezes. He has no rales.  Abdominal: Soft. He exhibits no distension. There is no tenderness. There is no rebound and no guarding.  Musculoskeletal: Normal range of motion. He exhibits no edema and no tenderness.  Neurological: He is alert and oriented to person, place, and time. No sensory deficit.  Moves the right arm normally.  Will move the left fingers some.  Notes that sensation is intact bilaterally.  Pt will only mumble and will open eyes occasionally  Skin: Skin is warm and dry. No rash noted. No erythema.  Psychiatric: He has a normal mood and affect. His behavior is normal.    ED Course  Procedures (including critical care time) Labs Review Labs Reviewed  CBC WITH DIFFERENTIAL - Abnormal; Notable for the following:    MCHC 36.7 (*)    All other components within normal limits  ETHANOL - Abnormal; Notable for the following:    Alcohol, Ethyl (B) 211 (*)    All other components within normal limits  COMPREHENSIVE METABOLIC PANEL   Imaging Review Ct Head Wo Contrast  08/19/2013   CLINICAL DATA:  Patient was awake only drinking with mental status changes, headache, left-sided weakness  EXAM: CT HEAD WITHOUT CONTRAST  TECHNIQUE: Contiguous axial images were obtained from the base of the skull through the vertex without intravenous contrast.  COMPARISON:  02/1912  FINDINGS: No mass lesion. No midline shift. No acute hemorrhage or hematoma. No extra-axial fluid collections. No evidence of acute infarction. Calvarium  intact. No significant inflammatory change in the visualized portions of the sinuses.  IMPRESSION: Negative   Electronically Signed   By: Esperanza Heir M.D.   On: 08/19/2013 09:29    EKG Interpretation   None      Date: 08/19/2013  Rate: 83  Rhythm: normal sinus rhythm  QRS Axis: normal  Intervals: PR prolonged  ST/T Wave abnormalities: early repolarization   Conduction Disutrbances:none  Narrative Interpretation:   Old EKG Reviewed: unchanged    MDM   1. Alcohol abuse     Patient here by EMS after complaints of drinking and smoking all night with headache. On my exam patient will barely wake up and only grumbles when asked questions. He he does a knowledge that he feels me touching both sides and he can move all extremities.  Feel that patient's exam is mostly related to alcohol intoxication. He is a poor historian and there is no one to confirm his symptoms or story. CBC, CMP, EtOH, head CT pending. EKG was done upon arrival which was within normal limits except for early repolarization.  1:21 PM Imaging neg and pt is intoxicated.  Pt now walking around and ambulating without difficulty.  Will d/c home.  Gwyneth Sprout, MD 08/19/13 1324  Gwyneth Sprout, MD 08/19/13 1350

## 2014-07-22 ENCOUNTER — Emergency Department (HOSPITAL_COMMUNITY)
Admission: EM | Admit: 2014-07-22 | Discharge: 2014-07-22 | Disposition: A | Discharge status: Home / Self Care | Payer: Self-pay | Attending: Emergency Medicine | Admitting: Emergency Medicine

## 2014-07-22 ENCOUNTER — Encounter (HOSPITAL_COMMUNITY): Payer: Self-pay | Admitting: Emergency Medicine

## 2014-07-22 DIAGNOSIS — F17200 Nicotine dependence, unspecified, uncomplicated: Secondary | ICD-10-CM | POA: Insufficient documentation

## 2014-07-22 DIAGNOSIS — IMO0002 Reserved for concepts with insufficient information to code with codable children: Secondary | ICD-10-CM

## 2014-07-22 DIAGNOSIS — S51809A Unspecified open wound of unspecified forearm, initial encounter: Secondary | ICD-10-CM | POA: Insufficient documentation

## 2014-07-22 DIAGNOSIS — S0990XA Unspecified injury of head, initial encounter: Secondary | ICD-10-CM | POA: Insufficient documentation

## 2014-07-22 DIAGNOSIS — R404 Transient alteration of awareness: Secondary | ICD-10-CM | POA: Insufficient documentation

## 2014-07-22 DIAGNOSIS — Z23 Encounter for immunization: Secondary | ICD-10-CM | POA: Insufficient documentation

## 2014-07-22 MED ORDER — ACETAMINOPHEN 325 MG PO TABS
650.0000 mg | ORAL_TABLET | Freq: Once | ORAL | Status: AC
  Administered 2014-07-22: 650 mg via ORAL
  Filled 2014-07-22: qty 2

## 2014-07-22 MED ORDER — LIDOCAINE-EPINEPHRINE 2 %-1:100000 IJ SOLN
1.7000 mL | Freq: Once | INTRAMUSCULAR | Status: DC
  Filled 2014-07-22: qty 1.7

## 2014-07-22 MED ORDER — TETANUS-DIPHTH-ACELL PERTUSSIS 5-2.5-18.5 LF-MCG/0.5 IM SUSP
0.5000 mL | Freq: Once | INTRAMUSCULAR | Status: AC
  Administered 2014-07-22: 0.5 mL via INTRAMUSCULAR
  Filled 2014-07-22: qty 0.5

## 2014-07-22 MED ORDER — LIDOCAINE HCL (PF) 1 % IJ SOLN
5.0000 mL | Freq: Once | INTRAMUSCULAR | Status: AC
  Administered 2014-07-22: 2.5 mL

## 2014-07-22 MED ORDER — LIDOCAINE-EPINEPHRINE 2 %-1:100000 IJ SOLN
20.0000 mL | Freq: Once | INTRAMUSCULAR | Status: DC
  Filled 2014-07-22: qty 20

## 2014-07-22 MED ORDER — LIDOCAINE HCL (PF) 1 % IJ SOLN
INTRAMUSCULAR | Status: AC
  Administered 2014-07-22: 2.5 mL
  Filled 2014-07-22: qty 5

## 2014-07-22 NOTE — Discharge Instructions (Signed)
Laceration Care, Adult Nicholas Medina, you were seen after being cut.  The cut was very dirty and may get infected despite washing it out.  Return to the ED immediately for worsening pain, swelling, drainage of pus.  You need to follow up with your regular doctor within 7-10 days for suture removal and wound check.  Come back to the ED if you can not get an appointment.  Thank you. A laceration is a cut that goes through all layers of the skin. The cut goes into the tissue beneath the skin. HOME CARE For stitches (sutures) or staples:  Keep the cut clean and dry.  If you have a bandage (dressing), change it at least once a day. Change the bandage if it gets wet or dirty, or as told by your doctor.  Wash the cut with soap and water 2 times a day. Rinse the cut with water. Pat it dry with a clean towel.  Put a thin layer of medicated cream on the cut as told by your doctor.  You may shower after the first 24 hours. Do not soak the cut in water until the stitches are removed.  Only take medicines as told by your doctor.  Have your stitches or staples removed as told by your doctor. For skin adhesive strips:  Keep the cut clean and dry.  Do not get the strips wet. You may take a bath, but be careful to keep the cut dry.  If the cut gets wet, pat it dry with a clean towel.  The strips will fall off on their own. Do not remove the strips that are still stuck to the cut. For wound glue:  You may shower or take baths. Do not soak or scrub the cut. Do not swim. Avoid heavy sweating until the glue falls off on its own. After a shower or bath, pat the cut dry with a clean towel.  Do not put medicine on your cut until the glue falls off.  If you have a bandage, do not put tape over the glue.  Avoid lots of sunlight or tanning lamps until the glue falls off. Put sunscreen on the cut for the first year to reduce your scar.  The glue will fall off on its own. Do not pick at the glue. You may need a  tetanus shot if:  You cannot remember when you had your last tetanus shot.  You have never had a tetanus shot. If you need a tetanus shot and you choose not to have one, you may get tetanus. Sickness from tetanus can be serious. GET HELP RIGHT AWAY IF:   Your pain does not get better with medicine.  Your arm, hand, leg, or foot loses feeling (numbness) or changes color.  Your cut is bleeding.  Your joint feels weak, or you cannot use your joint.  You have painful lumps on your body.  Your cut is red, puffy (swollen), or painful.  You have a red line on the skin near the cut.  You have yellowish-white fluid (pus) coming from the cut.  You have a fever.  You have a bad smell coming from the cut or bandage.  Your cut breaks open before or after stitches are removed.  You notice something coming out of the cut, such as wood or glass.  You cannot move a finger or toe. MAKE SURE YOU:   Understand these instructions.  Will watch your condition.  Will get help right away if you  are not doing well or get worse. Document Released: 04/13/2008 Document Revised: 01/18/2012 Document Reviewed: 04/21/2011 [facility] Patient Information 2015 [place], [place]. This information is not intended to replace advice given to you by your health care provider. Make sure you discuss any questions you have with your health care provider.  Sutured Wound Care Sutures are stitches that can be used to close wounds. Caring for your wound can help stop infection and lessen pain. HOME CARE   Rest and raise (elevate) the injured area until the pain and puffiness (swelling) go away.  Only take medicines as told by your doctor.  Clean the wound gently with mild soap and water once a day after the first 2 days. Rinse off the soap. Pat the area dry with a clean towel. Do not rub the wound.  Change the bandage (dressing) as told by your doctor. If the bandage sticks, soak it off with soapy water. Stop using  a bandage after 2 days or after the wound stops leaking fluid.  Put cream on the wound as told by your doctor.  Do not stretch the wound.  Drink enough fluids to keep your pee (urine) clear or pale yellow.  See your doctor to have the sutures removed.  Use sunscreen or sunblock on the wound after it heals. GET HELP RIGHT AWAY IF:   Your wound gets red, puffy, hot, or tender.  You have more pain in the wound.  You have a red streak that goes away from the wound.  You see yellowish-white fluid (pus) coming out of the wound.  You have a fever.  You have chills and start to shake.  You notice a bad smell coming from the wound.  Your wound will not stop bleeding. MAKE SURE YOU:   Understand these instructions.  Will watch your condition.  Will get help right away if you are not doing well or get worse. Document Released: 04/13/2008 Document Revised: 01/18/2012 Document Reviewed: 03/01/2011 [facility] Patient Information 2015 [place], [place]. This information is not intended to replace advice given to you by your health care provider. Make sure you discuss any questions you have with your health care provider.

## 2014-07-22 NOTE — ED Notes (Signed)
Family has returned to his room w/ family member. Pt informed of need for safety and pt to remain in his room until discharge.
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
## 2014-10-16 ENCOUNTER — Emergency Department (HOSPITAL_COMMUNITY): Payer: Self-pay

## 2014-10-16 ENCOUNTER — Emergency Department (HOSPITAL_COMMUNITY)
Admission: EM | Admit: 2014-10-16 | Discharge: 2014-10-16 | Disposition: A | Discharge status: Home / Self Care | Payer: Self-pay | Attending: Emergency Medicine | Admitting: Emergency Medicine

## 2014-10-16 ENCOUNTER — Encounter (HOSPITAL_COMMUNITY): Payer: Self-pay | Admitting: *Deleted

## 2014-10-16 DIAGNOSIS — R079 Chest pain, unspecified: Secondary | ICD-10-CM

## 2014-10-16 DIAGNOSIS — R091 Pleurisy: Secondary | ICD-10-CM

## 2014-10-16 DIAGNOSIS — Z720 Tobacco use: Secondary | ICD-10-CM | POA: Insufficient documentation

## 2014-10-16 LAB — TROPONIN I: Troponin I: <0.3 ng/mL (ref <0.30)

## 2014-10-16 LAB — BASIC METABOLIC PANEL
Anion gap: 15 (ref 5–15)
BUN: 10 mg/dL (ref 6–23)
CHLORIDE: 100 meq/L (ref 96–112)
CO2: 25 meq/L (ref 19–32)
CREATININE: 0.98 mg/dL (ref 0.50–1.35)
Calcium: 9.5 mg/dL (ref 8.4–10.5)
GFR calc non Af Amer: >90 mL/min (ref >90)
GLUCOSE: 81 mg/dL (ref 70–99)
POTASSIUM: 3.7 meq/L (ref 3.7–5.3)
Sodium: 140 mEq/L (ref 137–147)

## 2014-10-16 LAB — CBC WITH DIFFERENTIAL/PLATELET
Basophils Absolute: 0 10*3/uL (ref 0.0–0.1)
Basophils Relative: 0 % (ref 0–1)
Eosinophils Absolute: 0.5 10*3/uL (ref 0.0–0.7)
Eosinophils Relative: 5 % (ref 0–5)
HEMATOCRIT: 40.8 % (ref 39.0–52.0)
HEMOGLOBIN: 14.9 g/dL (ref 13.0–17.0)
LYMPHS ABS: 3.1 10*3/uL (ref 0.7–4.0)
LYMPHS PCT: 28 % (ref 12–46)
MCH: 30.7 pg (ref 26.0–34.0)
MCHC: 36.5 g/dL — AB (ref 30.0–36.0)
MCV: 84 fL (ref 78.0–100.0)
MONO ABS: 1.4 10*3/uL — AB (ref 0.1–1.0)
MONOS PCT: 13 % — AB (ref 3–12)
NEUTROS ABS: 6 10*3/uL (ref 1.7–7.7)
NEUTROS PCT: 54 % (ref 43–77)
Platelets: 218 10*3/uL (ref 150–400)
RBC: 4.86 MIL/uL (ref 4.22–5.81)
RDW: 13.6 % (ref 11.5–15.5)
WBC: 11 10*3/uL — ABNORMAL HIGH (ref 4.0–10.5)

## 2014-10-16 LAB — D-DIMER, QUANTITATIVE (NOT AT ARMC)

## 2014-10-16 MED ORDER — IBUPROFEN 600 MG PO TABS: 600.0000 mg | ORAL_TABLET | Freq: Four times a day (QID) | ORAL | Status: DC | PRN

## 2014-10-16 MED ORDER — KETOROLAC TROMETHAMINE 30 MG/ML IJ SOLN
30.0000 mg | Freq: Once | INTRAMUSCULAR | Status: AC
  Administered 2014-10-16: 30 mg via INTRAVENOUS
  Filled 2014-10-16: qty 1

## 2014-10-16 NOTE — ED Notes (Signed)
Patient presents with c/o cough for 2 weeks and now chest pain that started tonight

## 2014-10-16 NOTE — ED Provider Notes (Signed)
CSN: [id number]     Arrival date & time 10/16/14  0422 History   First MD Initiated Contact with Patient 10/16/14 [phone]     Chief Complaint  Patient presents with  . Chest Pain     (Consider location/radiation/quality/duration/timing/severity/associated sxs/prior Treatment) HPI Patient states he's had a dry nonproductive cough for the past 2 weeks. He woke this morning with sharp right-sided chest pain. The pain is worse with deep breathing and coughing. He's had no fever or chills. Patient denies any recent extended travel or surgeries. He's had no lower extremity swelling or pain. He has no personal or family history of DVT/PE. History reviewed. No pertinent past medical history. History reviewed. No pertinent past surgical history. No family history on file. History  Substance Use Topics  . Smoking status: Current Every Day Smoker -- 0.50 packs/day  . Smokeless tobacco: Never Used  . Alcohol Use: Yes    Review of Systems  Constitutional: Negative for fever and chills.  HENT: Negative for congestion.   Respiratory: Negative for cough, shortness of breath and wheezing.   Cardiovascular: Positive for chest pain. Negative for palpitations and leg swelling.  Gastrointestinal: Negative for nausea, vomiting, abdominal pain and diarrhea.  Musculoskeletal: Negative for back pain, neck pain and neck stiffness.  Skin: Negative for rash and wound.  Neurological: Negative for dizziness, weakness, light-headedness, numbness and headaches.  All other systems reviewed and are negative.     Allergies  Review of patient's allergies indicates no known allergies.  Home Medications   Prior to Admission medications   Not on File   BP 127/70 mmHg  Pulse 72  Temp(Src) 98.7 F (37.1 C) (Oral)  Resp 16  Ht 5\' 11"  (1.803 m)  Wt 170 lb (77.111 kg)  BMI 23.72 kg/m2  SpO2 98% Physical Exam  Constitutional: He is oriented to person, place, and time. He appears well-developed and  well-nourished. No distress.  HENT:  Head: Normocephalic and atraumatic.  Mouth/Throat: Oropharynx is clear and moist.  Eyes: EOM are normal. Pupils are equal, round, and reactive to light.  Neck: Normal range of motion. Neck supple.  Cardiovascular: Normal rate and regular rhythm.   Pulmonary/Chest: Effort normal and breath sounds normal. No respiratory distress. He has no wheezes. He has no rales. He exhibits no tenderness.  Abdominal: Soft. Bowel sounds are normal. He exhibits no distension and no mass. There is no tenderness. There is no rebound and no guarding.  Musculoskeletal: Normal range of motion. He exhibits no edema or tenderness.  No calf swelling or tenderness.  Neurological: He is alert and oriented to person, place, and time.  Moves all extremities without deficit. Sensation is grossly intact.  Skin: Skin is warm and dry. No rash noted. No erythema.  Psychiatric: He has a normal mood and affect. His behavior is normal.  Nursing note and vitals reviewed.   ED Course  Procedures (including critical care time) Labs Review Labs Reviewed  CBC WITH DIFFERENTIAL - Abnormal; Notable for the following:    WBC 11.0 (*)    MCHC 36.5 (*)    Monocytes Relative 13 (*)    Monocytes Absolute 1.4 (*)    All other components within normal limits  BASIC METABOLIC PANEL  TROPONIN I  D-DIMER, QUANTITATIVE    Imaging Review Dg Chest 2 View  10/16/2014   CLINICAL DATA:  Chest pain today.  EXAM: CHEST  2 VIEW  COMPARISON:  10/10/2010  FINDINGS: The heart size and mediastinal contours are within normal limits.  Both lungs are clear. The visualized skeletal structures are unremarkable.  IMPRESSION: No active cardiopulmonary disease.   Electronically Signed   By: Lucienne Capers M.D.   On: 10/16/2014 05:18     EKG Interpretation None      Date: 10/16/2014  Rate: 82  Rhythm: normal sinus rhythm  QRS Axis: normal  Intervals: normal  ST/T Wave abnormalities: nonspecific T wave  changes  Conduction Disutrbances:none  Narrative Interpretation:   Old EKG Reviewed: none available   MDM   Final diagnoses:  Chest pain    Patient presents with pleuritic right-sided chest pain. EKG without concerning findings. Chest x-ray is normal. We'll screen for PE with d-dimer. Patient's symptoms likely represent pleurisy.  D-dimer is negative. We'll treat with NSAIDs. Return precautions given.  Julianne Rice, MD 10/16/14 2352

## 2014-10-16 NOTE — Discharge Instructions (Signed)

## 2014-10-16 NOTE — ED Notes (Signed)
Patient transported to X-ray 

## 2015-10-08 ENCOUNTER — Encounter (HOSPITAL_COMMUNITY): Payer: Self-pay | Admitting: Emergency Medicine

## 2015-10-08 ENCOUNTER — Emergency Department (HOSPITAL_COMMUNITY)
Admission: EM | Admit: 2015-10-08 | Discharge: 2015-10-08 | Disposition: A | Discharge status: Home / Self Care | Payer: Self-pay | Attending: Emergency Medicine | Admitting: Emergency Medicine

## 2015-10-08 ENCOUNTER — Emergency Department (HOSPITAL_COMMUNITY): Payer: Self-pay

## 2015-10-08 DIAGNOSIS — D72829 Elevated white blood cell count, unspecified: Secondary | ICD-10-CM | POA: Insufficient documentation

## 2015-10-08 DIAGNOSIS — F17200 Nicotine dependence, unspecified, uncomplicated: Secondary | ICD-10-CM | POA: Insufficient documentation

## 2015-10-08 DIAGNOSIS — R109 Unspecified abdominal pain: Secondary | ICD-10-CM

## 2015-10-08 DIAGNOSIS — Z880 Allergy status to penicillin: Secondary | ICD-10-CM | POA: Insufficient documentation

## 2015-10-08 LAB — CBC WITH DIFFERENTIAL/PLATELET
BASOS ABS: 0 10*3/uL (ref 0.0–0.1)
BASOS PCT: 0 %
Eosinophils Absolute: 0.3 10*3/uL (ref 0.0–0.7)
Eosinophils Relative: 2 %
HEMATOCRIT: 39 % (ref 39.0–52.0)
HEMOGLOBIN: 13.9 g/dL (ref 13.0–17.0)
LYMPHS PCT: 20 %
Lymphs Abs: 3.3 10*3/uL (ref 0.7–4.0)
MCH: 29.9 pg (ref 26.0–34.0)
MCHC: 35.6 g/dL (ref 30.0–36.0)
MCV: 83.9 fL (ref 78.0–100.0)
MONO ABS: 1.2 10*3/uL — AB (ref 0.1–1.0)
Monocytes Relative: 7 %
NEUTROS ABS: 12 10*3/uL — AB (ref 1.7–7.7)
NEUTROS PCT: 71 %
Platelets: 306 10*3/uL (ref 150–400)
RBC: 4.65 MIL/uL (ref 4.22–5.81)
RDW: 13.7 % (ref 11.5–15.5)
WBC: 16.8 10*3/uL — AB (ref 4.0–10.5)

## 2015-10-08 LAB — LIPASE, BLOOD: Lipase: 28 U/L (ref 11–51)

## 2015-10-08 LAB — URINE MICROSCOPIC-ADD ON

## 2015-10-08 LAB — URINALYSIS, ROUTINE W REFLEX MICROSCOPIC
BILIRUBIN URINE: NEGATIVE
Glucose, UA: NEGATIVE mg/dL
Ketones, ur: NEGATIVE mg/dL
LEUKOCYTES UA: NEGATIVE
NITRITE: NEGATIVE
PH: 5.5 (ref 5.0–8.0)
Protein, ur: NEGATIVE mg/dL
SPECIFIC GRAVITY, URINE: 1.014 (ref 1.005–1.030)

## 2015-10-08 LAB — COMPREHENSIVE METABOLIC PANEL
ALBUMIN: 3.4 g/dL — AB (ref 3.5–5.0)
ALT: 21 U/L (ref 17–63)
ANION GAP: 6 (ref 5–15)
AST: 24 U/L (ref 15–41)
Alkaline Phosphatase: 63 U/L (ref 38–126)
BILIRUBIN TOTAL: 0.6 mg/dL (ref 0.3–1.2)
BUN: 8 mg/dL (ref 6–20)
CO2: 29 mmol/L (ref 22–32)
Calcium: 9.3 mg/dL (ref 8.9–10.3)
Chloride: 102 mmol/L (ref 101–111)
Creatinine, Ser: 1.06 mg/dL (ref 0.61–1.24)
GFR calc non Af Amer: >60 mL/min (ref >60)
GLUCOSE: 80 mg/dL (ref 65–99)
POTASSIUM: 4.2 mmol/L (ref 3.5–5.1)
Sodium: 137 mmol/L (ref 135–145)
TOTAL PROTEIN: 7.3 g/dL (ref 6.5–8.1)

## 2015-10-08 MED ORDER — CYCLOBENZAPRINE HCL 5 MG PO TABS: 5.0000 mg | ORAL_TABLET | Freq: Three times a day (TID) | ORAL | Status: DC | PRN

## 2015-10-08 MED ORDER — SODIUM CHLORIDE 0.9 % IV BOLUS (SEPSIS)
1000.0000 mL | Freq: Once | INTRAVENOUS | Status: AC
  Administered 2015-10-08: 1000 mL via INTRAVENOUS

## 2015-10-08 MED ORDER — NAPROXEN 375 MG PO TABS: 375.0000 mg | ORAL_TABLET | Freq: Two times a day (BID) | ORAL | Status: DC

## 2015-10-08 NOTE — ED Provider Notes (Signed)
CSN: [id number]     Arrival date & time 10/08/15  0919 History   First MD Initiated Contact with Patient 10/08/15 1210     Chief Complaint  Patient presents with  . Flank Pain     Patient is a 39 y.o. male presenting with flank pain. The history is provided by the patient. No language interpreter was used.  Flank Pain   Nicholas Medina is a 39 y.o. male who presents to the Emergency Department complaining of left flank pain.  He reports 1 week of left flank pain. Pain radiates down to his left lower quadrant. He has associated nausea but no vomiting, dysuria, penile discharge. Pain is worse with meals. No change with ambulation. He has some associated constipation. No fevers. No injuries. No prior similar symptoms. He reports he is a moderate drinker and drank a little bit extra last week. Symptoms are moderate, waxing waning, worsening. He denies any IV drug use, numbness, weakness, incontinence.   History reviewed. No pertinent past medical history. History reviewed. No pertinent past surgical history. No family history on file. Social History  Substance Use Topics  . Smoking status: Current Every Day Smoker -- 0.50 packs/day  . Smokeless tobacco: Never Used  . Alcohol Use: Yes    Review of Systems  Genitourinary: Positive for flank pain.  All other systems reviewed and are negative.     Allergies  Penicillins  Home Medications   Prior to Admission medications   Medication Sig Start Date End Date Taking? Authorizing Provider  naproxen sodium (ANAPROX) 220 MG tablet Take 220 mg by mouth daily as needed (for pain).   Yes Historical Provider, MD  pseudoephedrine-acetaminophen (TYLENOL SINUS) 30-500 MG TABS tablet Take 1 tablet by mouth every 4 (four) hours as needed (for cold).   Yes Historical Provider, MD  cyclobenzaprine (FLEXERIL) 5 MG tablet Take 1 tablet (5 mg total) by mouth 3 (three) times daily as needed for muscle spasms. 10/08/15   Quintella Reichert, MD  naproxen  (NAPROSYN) 375 MG tablet Take 1 tablet (375 mg total) by mouth 2 (two) times daily. 10/08/15   Quintella Reichert, MD   BP 123/98 mmHg  Pulse 72  Temp(Src) 97.8 F (36.6 C) (Oral)  Resp 12  SpO2 100% Physical Exam  Constitutional: He is oriented to person, place, and time. He appears well-developed and well-nourished.  HENT:  Head: Normocephalic and atraumatic.  Cardiovascular: Normal rate and regular rhythm.   No murmur heard. Pulmonary/Chest: Effort normal and breath sounds normal. No respiratory distress.  Abdominal: Soft. There is no rebound and no guarding.  Mild left lower quadrant and flank tenderness  Musculoskeletal: He exhibits no edema or tenderness.  Neurological: He is alert and oriented to person, place, and time.  Skin: Skin is warm and dry.  Psychiatric: He has a normal mood and affect. His behavior is normal.  Nursing note and vitals reviewed.   ED Course  Procedures (including critical care time) Labs Review Labs Reviewed  URINALYSIS, ROUTINE W REFLEX MICROSCOPIC (NOT AT [facility]) - Abnormal; Notable for the following:    Hgb urine dipstick TRACE (*)    All other components within normal limits  COMPREHENSIVE METABOLIC PANEL - Abnormal; Notable for the following:    Albumin 3.4 (*)    All other components within normal limits  CBC WITH DIFFERENTIAL/PLATELET - Abnormal; Notable for the following:    WBC 16.8 (*)    Neutro Abs 12.0 (*)    Monocytes Absolute 1.2 (*)  All other components within normal limits  URINE MICROSCOPIC-ADD ON - Abnormal; Notable for the following:    Squamous Epithelial / LPF 0-5 (*)    Bacteria, UA RARE (*)    All other components within normal limits  URINE CULTURE  LIPASE, BLOOD    Imaging Review Ct Renal Stone Study  10/08/2015  CLINICAL DATA:  Left flank pain for 1 week EXAM: CT ABDOMEN AND PELVIS WITHOUT CONTRAST TECHNIQUE: Multidetector CT imaging of the abdomen and pelvis was performed following the standard protocol without  oral or intravenous contrast material administration. COMPARISON:  August 26, 2008 FINDINGS: Lower chest:  Lung bases are clear. Hepatobiliary: Liver is prominent, measuring 18.9 cm in length. No focal liver lesions are identified on this noncontrast enhanced study. Gallbladder wall is not appreciably thickened. There is no biliary duct dilatation. Pancreas: There is no pancreatic mass or inflammatory focus. Spleen: No splenic lesions are identified. Adrenals/Urinary Tract: Right adrenal appears normal. There is a 1.1 x 0.7 cm left adrenal adenoma. Kidneys bilaterally show no mass or hydronephrosis on either side. There is no renal or ureteral calculus on either side. Urinary bladder is midline with wall thickness within normal limits. Stomach/Bowel: There is no bowel wall or mesenteric thickening. No bowel obstruction. No free air or portal venous air. No demonstrable fistula or pneumatosis. Vascular/lLymphatic: There is no abdominal aortic aneurysm. No vascular lesions are appreciable on this noncontrast enhanced study. There is no adenopathy in the abdomen or pelvis. Reproductive: No prostatic or seminal vesicle lesions are identified. No pelvic mass or pelvic fluid collection. Other: Appendix appears normal. No abscess or ascites in the abdomen or pelvis. Musculoskeletal: There is degenerative change at L5-S1 with vacuum phenomenon at this level. There are no blastic or lytic bone lesions. There is an intramuscular or abdominal wall lesion. IMPRESSION: No renal or ureteral calculus on either side.  No hydronephrosis. No bowel obstruction or bowel wall thickening. No abscess. Appendix region appears normal. Small benign left adrenal adenoma. Prominent liver without focal lesion appreciable. Electronically Signed   By: Lowella Grip III M.D.   On: 10/08/2015 15:39   I have personally reviewed and evaluated these images and lab results as part of my medical decision-making.   EKG Interpretation None       MDM   Final diagnoses:  Flank pain  Leukocytosis    Patient here for violation of left flank pain. Initial concern for possible renal colic. CT scan with no evidence of stones. His pain is improved in the emergency department following IV fluids. Patient is not consistent with cauda equina, epidural abscess, UTI. CBC with leukocytosis, unclear significance. Treating pain with naproxen, Flexeril when necessary, outpatient follow-up, close return precautions.  Quintella Reichert, MD 10/08/15 1606

## 2015-10-08 NOTE — ED Notes (Signed)
Per pt, states left flank pain with no dysuria, no change in appetite

## 2015-10-08 NOTE — ED Notes (Signed)
CT renal study not yet completed

## 2015-10-08 NOTE — Discharge Instructions (Signed)
Flank Pain Flank pain refers to pain that is located on the side of the body between the upper abdomen and the back. The pain may occur over a short period of time (acute) or may be long-term or reoccurring (chronic). It may be mild or severe. Flank pain can be caused by many things. CAUSES  Some of the more common causes of flank pain include:  Muscle strains.   Muscle spasms.   A disease of your spine (vertebral disk disease).   A lung infection (pneumonia).   Fluid around your lungs (pulmonary edema).   A kidney infection.   Kidney stones.   A very painful skin rash caused by the chickenpox virus (shingles).   Gallbladder disease.  [place] care will depend on the cause of your pain. In general,  Rest as directed by your caregiver.  Drink enough fluids to keep your urine clear or pale yellow.  Only take over-the-counter or prescription medicines as directed by your caregiver. Some medicines may help relieve the pain.  Tell your caregiver about any changes in your pain.  Follow up with your caregiver as directed. SEEK IMMEDIATE MEDICAL CARE IF:   Your pain is not controlled with medicine.   You have new or worsening symptoms.  Your pain increases.   You have abdominal pain.   You have shortness of breath.   You have persistent nausea or vomiting.   You have swelling in your abdomen.   You feel faint or pass out.   You have blood in your urine.  You have a fever or persistent symptoms for more than 2-3 days.  You have a fever and your symptoms suddenly get worse. MAKE SURE YOU:   Understand these instructions.  Will watch your condition.  Will get help right away if you are not doing well or get worse.   This information is not intended to replace advice given to you by your health care provider. Make sure you discuss any questions you have with your health care provider.   Document Released: 12/17/2005 Document  Revised: 07/20/2012 Document Reviewed: 06/09/2012 Elsevier Interactive Patient Education 2016 [employer]  Leukocytosis Leukocytosis means you have more white blood cells than normal. White blood cells are made in your bone marrow. The main job of white blood cells is to fight infection. Having too many white blood cells is a common condition. It can develop as a result of many types of medical problems. CAUSES  In some cases, your bone marrow may be normal, but it is still making too many white blood cells. This could be the result of:  Infection.  Injury.  Physical stress.  Emotional stress.  Surgery.  Allergic reactions.  Tumors that do not start in the blood or bone marrow.  An inherited disease.  Certain medicines.  Pregnancy and labor. In other cases, you may have a bone marrow disorder that is causing your body to make too many white blood cells. Bone marrow disorders include:  Leukemia. This is a type of blood cancer.  Myeloproliferative disorders. These disorders cause blood cells to grow abnormally. SYMPTOMS  Some people have no symptoms. Others have symptoms due to the medical problem that is causing their leukocytosis. These symptoms may include:  Bleeding.  Bruising.  Fever.  Night sweats.  Repeated infections.  Weakness.  Weight loss. DIAGNOSIS  Leukocytosis is often found during blood tests that are done as part of a normal physical exam. Your caregiver will probably order  other tests to help determine why you have too many white blood cells. These tests may include:  A complete blood count (CBC). This test measures all the types of blood cells in your body.  Chest X-rays, urine tests (urinalysis), or other tests to look for signs of infection.  Bone marrow aspiration. For this test, a needle is put into your bone. Cells from the bone marrow are removed through the needle. The cells are then examined under a microscope. TREATMENT  Treatment  is usually not needed for leukocytosis. However, if a disorder is causing your leukocytosis, it will need to be treated. Treatment may include:  Antibiotic medicines if you have a bacterial infection.  Bone marrow transplant. Your diseased bone marrow is replaced with healthy cells that will grow new bone marrow.  Chemotherapy. This is the use of drugs to kill cancer cells. HOME CARE INSTRUCTIONS  Only take over-the-counter or prescription medicines as directed by your caregiver.  Maintain a healthy weight. Ask your caregiver what weight is best for you.  Eat foods that are low in saturated fats and high in fiber. Eat plenty of fruits and vegetables.  Drink enough fluids to keep your urine clear or pale yellow.  Get 30 minutes of exercise at least 5 times a week. Check with your caregiver before starting a new exercise routine.  Limit caffeine and alcohol.  Do not smoke.  Keep all follow-up appointments as directed by your caregiver. SEEK MEDICAL CARE IF:  You feel weak or more tired than usual.  You develop chills, a cough, or nasal congestion.  You lose weight without trying.  You have night sweats.  You bruise easily. SEEK IMMEDIATE MEDICAL CARE IF:  You bleed more than normal.  You have chest pain.  You have trouble breathing.  You have a fever.  You have uncontrolled nausea or vomiting.  You feel dizzy or lightheaded. MAKE SURE YOU:  Understand these instructions.  Will watch your condition.  Will get help right away if you are not doing well or get worse.   This information is not intended to replace advice given to you by your health care provider. Make sure you discuss any questions you have with your health care provider.   Document Released: 10/15/2011 Document Revised: 01/18/2012 Document Reviewed: 04/29/2015 Elsevier Interactive Patient Education [employer].

## 2015-10-09 LAB — URINE CULTURE: CULTURE: NO GROWTH

## 2016-01-29 ENCOUNTER — Emergency Department (HOSPITAL_COMMUNITY)
Admission: EM | Admit: 2016-01-29 | Discharge: 2016-01-29 | Disposition: A | Discharge status: Home / Self Care | Payer: Self-pay | Attending: Emergency Medicine | Admitting: Emergency Medicine

## 2016-01-29 ENCOUNTER — Encounter (HOSPITAL_COMMUNITY): Payer: Self-pay | Admitting: Family Medicine

## 2016-01-29 DIAGNOSIS — F17200 Nicotine dependence, unspecified, uncomplicated: Secondary | ICD-10-CM | POA: Insufficient documentation

## 2016-01-29 DIAGNOSIS — S51831A Puncture wound without foreign body of right forearm, initial encounter: Secondary | ICD-10-CM | POA: Insufficient documentation

## 2016-01-29 DIAGNOSIS — Y9389 Activity, other specified: Secondary | ICD-10-CM | POA: Insufficient documentation

## 2016-01-29 DIAGNOSIS — Y998 Other external cause status: Secondary | ICD-10-CM | POA: Insufficient documentation

## 2016-01-29 DIAGNOSIS — W268XXA Contact with other sharp object(s), not elsewhere classified, initial encounter: Secondary | ICD-10-CM | POA: Insufficient documentation

## 2016-01-29 DIAGNOSIS — Y9289 Other specified places as the place of occurrence of the external cause: Secondary | ICD-10-CM | POA: Insufficient documentation

## 2016-01-29 NOTE — ED Notes (Addendum)
Tried to find pt to reassess vitals. Unable to locate pt at this time.

## 2016-01-29 NOTE — ED Notes (Signed)
No answer for ready room 

## 2016-01-29 NOTE — ED Notes (Signed)
No answer x2.  Triage states pt had ETOH on board and probably left.
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
## 2016-05-14 ENCOUNTER — Emergency Department (HOSPITAL_COMMUNITY)
Admission: EM | Admit: 2016-05-14 | Discharge: 2016-05-14 | Disposition: A | Discharge status: Home / Self Care | Payer: Self-pay | Attending: Emergency Medicine | Admitting: Emergency Medicine

## 2016-05-14 ENCOUNTER — Encounter (HOSPITAL_COMMUNITY): Payer: Self-pay

## 2016-05-14 DIAGNOSIS — J020 Streptococcal pharyngitis: Secondary | ICD-10-CM | POA: Insufficient documentation

## 2016-05-14 DIAGNOSIS — F17200 Nicotine dependence, unspecified, uncomplicated: Secondary | ICD-10-CM | POA: Insufficient documentation

## 2016-05-14 LAB — RAPID STREP SCREEN (MED CTR MEBANE ONLY): STREPTOCOCCUS, GROUP A SCREEN (DIRECT): POSITIVE — AB

## 2016-05-14 MED ORDER — LIDOCAINE VISCOUS 2 % MT SOLN
15.0000 mL | Freq: Once | OROMUCOSAL | Status: AC
  Administered 2016-05-14: 15 mL via OROMUCOSAL
  Filled 2016-05-14: qty 15

## 2016-05-14 MED ORDER — IBUPROFEN 100 MG/5ML PO SUSP
600.0000 mg | Freq: Once | ORAL | Status: AC
  Administered 2016-05-14: 600 mg via ORAL
  Filled 2016-05-14 (×2): qty 30

## 2016-05-14 MED ORDER — CEPHALEXIN 500 MG PO CAPS
500.0000 mg | ORAL_CAPSULE | Freq: Two times a day (BID) | ORAL | Status: DC
Start: 2016-05-14 — End: 2016-10-29

## 2016-05-14 MED ORDER — LIDOCAINE VISCOUS 2 % MT SOLN: 20.0000 mL | OROMUCOSAL | Status: DC | PRN

## 2016-05-14 NOTE — ED Provider Notes (Signed)
CSN: [id number]     Arrival date & time 05/14/16  1046 History  By signing my name below, I, Evelene Croon, attest that this documentation has been prepared under the direction and in the presence of non-physician practitioner, Janetta Hora, PA-C. Electronically Signed: Evelene Croon, Scribe. 05/14/2016. 11:26 AM.    Chief Complaint  Patient presents with  . Sore Throat   The history is provided by the patient. No language interpreter was used.   HPI Comments:  Nicholas Medina is a 40 y.o. male who presents to the Emergency Department complaining of sore throat x 2 days. He rates his pain an 8/10. His pain is exacerbated when he swallows. Pt reports associated body aches, HA, rhinorrhea, and  mild dry cough. He denies fever, ear pain, congestion, SOB, abdominal pain, nausea, vomiting and diarrhea.. He also denies recent sick contacts. No alleviating factors noted.  History reviewed. No pertinent past medical history. History reviewed. No pertinent past surgical history. No family history on file. Social History  Substance Use Topics  . Smoking status: Current Every Day Smoker -- 0.50 packs/day  . Smokeless tobacco: Never Used  . Alcohol Use: Yes    Review of Systems  Constitutional: Negative for fever.  HENT: Positive for rhinorrhea and sore throat. Negative for ear pain.   Respiratory: Positive for cough. Negative for shortness of breath.   Cardiovascular: Negative for palpitations.  Gastrointestinal: Negative for nausea, vomiting, abdominal pain and diarrhea.  Musculoskeletal: Positive for myalgias.  Neurological: Positive for headaches.    Allergies  Penicillins  Home Medications   Prior to Admission medications   Medication Sig Start Date End Date Taking? Authorizing Provider  cyclobenzaprine (FLEXERIL) 5 MG tablet Take 1 tablet (5 mg total) by mouth 3 (three) times daily as needed for muscle spasms. 10/08/15   Quintella Reichert, MD  naproxen (NAPROSYN) 375 MG tablet Take 1 tablet  (375 mg total) by mouth 2 (two) times daily. 10/08/15   Quintella Reichert, MD  naproxen sodium (ANAPROX) 220 MG tablet Take 220 mg by mouth daily as needed (for pain).    Historical Provider, MD  pseudoephedrine-acetaminophen (TYLENOL SINUS) 30-500 MG TABS tablet Take 1 tablet by mouth every 4 (four) hours as needed (for cold).    Historical Provider, MD   BP 119/75 mmHg  Pulse 92  Temp(Src) 98.7 F (37.1 C) (Oral)  Resp 18  Ht 5\' 11"  (1.803 m)  Wt 175 lb (79.379 kg)  BMI 24.42 kg/m2  SpO2 98%   Physical Exam  Constitutional: He is oriented to person, place, and time. He appears well-developed and well-nourished. No distress.  HENT:  Head: Normocephalic and atraumatic.  Right Ear: Hearing, tympanic membrane, external ear and ear canal normal.  Left Ear: Hearing, tympanic membrane, external ear and ear canal normal.  Nose: Mucosal edema and rhinorrhea present.  Mouth/Throat: Uvula is midline and mucous membranes are normal. Oropharyngeal exudate and posterior oropharyngeal erythema present. No posterior oropharyngeal edema or tonsillar abscesses.  Eyes: Conjunctivae are normal. Pupils are equal, round, and reactive to light. Right eye exhibits no discharge. Left eye exhibits no discharge. No scleral icterus.  Neck: Normal range of motion.  Cardiovascular: Normal rate and regular rhythm.  Exam reveals no gallop and no friction rub.   No murmur heard. Pulmonary/Chest: Effort normal and breath sounds normal. No respiratory distress. He has no wheezes. He has no rales. He exhibits no tenderness.  Abdominal: Soft. Bowel sounds are normal. He exhibits no distension. There is no tenderness.  Neurological: He is alert and oriented to person, place, and time.  Skin: Skin is warm and dry.  Psychiatric: He has a normal mood and affect.    ED Course  Procedures  Labs Review  DIAGNOSTIC STUDIES:  Oxygen Saturation is 98% on RA, normal by my interpretation.    COORDINATION OF CARE:  11:24 AM  Will order pain meds and strep test.  Discussed treatment plan with pt at bedside and pt agreed to plan.  Labs Reviewed  RAPID STREP SCREEN (NOT AT [facility]) - Abnormal; Notable for the following:    Streptococcus, Group A Screen (Direct) POSITIVE (*)    All other components within normal limits    Imaging Review No results found. I have personally reviewed and evaluated these images and lab results as part of my medical decision-making.   EKG Interpretation None      MDM   Final diagnoses:  Strep pharyngitis   40 year old male with strep pharyngitis. Pt rapid strep test positive. Pt is tolerating secretions. Presentation not concerning for peritonsillar abscess or spread of infection to deep spaces of the throat; patent airway. Pt will be discharged with Keflex due to PCN allergy.  Viscous lidocaine and motrin given here in ED. Specific return precautions discussed. Recommended PCP follow up. Pt appears safe for discharge.    I personally performed the services described in this documentation, which was scribed in my presence. The recorded information has been reviewed and is accurate.    Recardo Evangelist, PA-C 05/14/16 [place], MD 05/15/16 [phone]

## 2016-05-14 NOTE — ED Notes (Signed)
Per Pt, Pt is coming from home with complaints of fever, body aches, and a headache starting yesterday. Pt reports "feeling warm last night" but did not take temperature at home. Reports dry cough.

## 2016-10-29 ENCOUNTER — Emergency Department (HOSPITAL_COMMUNITY)
Admission: EM | Admit: 2016-10-29 | Discharge: 2016-10-29 | Disposition: A | Discharge status: Home / Self Care | Payer: Self-pay | Attending: Emergency Medicine | Admitting: Emergency Medicine

## 2016-10-29 ENCOUNTER — Encounter (HOSPITAL_COMMUNITY): Payer: Self-pay | Admitting: Emergency Medicine

## 2016-10-29 DIAGNOSIS — M545 Low back pain, unspecified: Secondary | ICD-10-CM

## 2016-10-29 DIAGNOSIS — R319 Hematuria, unspecified: Secondary | ICD-10-CM | POA: Insufficient documentation

## 2016-10-29 DIAGNOSIS — Y939 Activity, unspecified: Secondary | ICD-10-CM | POA: Insufficient documentation

## 2016-10-29 DIAGNOSIS — F17200 Nicotine dependence, unspecified, uncomplicated: Secondary | ICD-10-CM | POA: Insufficient documentation

## 2016-10-29 DIAGNOSIS — X500XXA Overexertion from strenuous movement or load, initial encounter: Secondary | ICD-10-CM | POA: Insufficient documentation

## 2016-10-29 DIAGNOSIS — Y929 Unspecified place or not applicable: Secondary | ICD-10-CM | POA: Insufficient documentation

## 2016-10-29 DIAGNOSIS — Y990 Civilian activity done for income or pay: Secondary | ICD-10-CM | POA: Insufficient documentation

## 2016-10-29 LAB — URINALYSIS, ROUTINE W REFLEX MICROSCOPIC
BACTERIA UA: NONE SEEN
BILIRUBIN URINE: NEGATIVE
Glucose, UA: NEGATIVE mg/dL
Ketones, ur: 5 mg/dL — AB
Leukocytes, UA: NEGATIVE
Nitrite: NEGATIVE
PROTEIN: NEGATIVE mg/dL
SPECIFIC GRAVITY, URINE: 1.026 (ref 1.005–1.030)
pH: 5 (ref 5.0–8.0)

## 2016-10-29 MED ORDER — NAPROXEN 500 MG PO TABS: 500.0000 mg | ORAL_TABLET | Freq: Two times a day (BID) | ORAL | 0 refills | Status: DC

## 2016-10-29 MED ORDER — METHOCARBAMOL 500 MG PO TABS: 1000.0000 mg | ORAL_TABLET | Freq: Four times a day (QID) | ORAL | 0 refills | Status: DC

## 2016-10-29 NOTE — Discharge Instructions (Signed)
Please read and follow all provided instructions.  Your diagnoses today include:  1. Acute bilateral low back pain without sciatica   2. Hematuria, unspecified type     Tests performed today include:  Vital signs - see below for your results today  Urine test - no infection or blood today  Test for gonorrhea and chlamydia - pending  Medications prescribed:   Naproxen - anti-inflammatory pain medication  Do not exceed 500mg  naproxen every 12 hours, take with food  You have been prescribed an anti-inflammatory medication or NSAID. Take with food. Take smallest effective dose for the shortest duration needed for your pain. Stop taking if you experience stomach pain or vomiting.    Robaxin (methocarbamol) - muscle relaxer medication  DO NOT drive or perform any activities that require you to be awake and alert because this medicine can make you drowsy.   Take any prescribed medications only as directed.  Home care instructions:   Follow any educational materials contained in this packet  Please rest, use ice or heat on your back for the next several days  Do not lift, push, pull anything more than 10 pounds for the next week  Follow-up instructions: Please follow-up with your primary care provider in the next 1 week for further evaluation of your symptoms.   Return instructions:  SEEK IMMEDIATE MEDICAL ATTENTION IF YOU HAVE:  New numbness, tingling, weakness, or problem with the use of your arms or legs  Severe back pain not relieved with medications  Loss control of your bowels or bladder  Increasing pain in any areas of the body (such as chest or abdominal pain)  Shortness of breath, dizziness, or fainting.   Worsening nausea (feeling sick to your stomach), vomiting, fever, or sweats  Any other emergent concerns regarding your health   Additional Information:  Your vital signs today were: BP 117/79 (BP Location: Left Arm)    Pulse 67    Temp 97.9 F (36.6  C)    Resp 16    SpO2 99%  If your blood pressure (BP) was elevated above 135/85 this visit, please have this repeated by your doctor within one month. --------------

## 2016-10-29 NOTE — ED Provider Notes (Signed)
[place] DEPT Provider Note   CSN: [id number] Arrival date & time: 10/29/16  [id number]     History   Chief Complaint Chief Complaint  Patient presents with  . Back Pain    HPI Nicholas Medina is a 40 y.o. male.  Patient presents with multiple complaints. Patient complains of back pain, worse in his middle lower back, worse with movement. He states that he has been lifting heavy plates at work, but is not sure that the back pain started with lifting. Back pain has been more waxing and waning in nature. Patient also notes that he had one episode of hematuria and dysuria. He denies increased frequency, urgency, penile discharge. Patient does not think he is at risk for STI. Patient also complains of an itchy rash over his entire back which started about a week ago, this is gradually improved and now involves on the lower back. Patient applied cortisone cream with some improvement.      History reviewed. No pertinent past medical history.  Patient Active Problem List   Diagnosis Date Noted  . ALCOHOL ABUSE 12/19/2010  . COCAINE ABUSE 12/19/2010    History reviewed. No pertinent surgical history.     Home Medications    Prior to Admission medications   Medication Sig Start Date End Date Taking? Authorizing Provider  cephALEXin (KEFLEX) 500 MG capsule Take 1 capsule (500 mg total) by mouth 2 (two) times daily. 05/14/16   Recardo Evangelist, PA-C  cyclobenzaprine (FLEXERIL) 5 MG tablet Take 1 tablet (5 mg total) by mouth 3 (three) times daily as needed for muscle spasms. 10/08/15   Quintella Reichert, MD  lidocaine (XYLOCAINE) 2 % solution Use as directed 20 mLs in the mouth or throat as needed for mouth pain. 05/14/16   Recardo Evangelist, PA-C  naproxen (NAPROSYN) 375 MG tablet Take 1 tablet (375 mg total) by mouth 2 (two) times daily. 10/08/15   Quintella Reichert, MD  naproxen sodium (ANAPROX) 220 MG tablet Take 220 mg by mouth daily as needed (for pain).    Historical Provider, MD    pseudoephedrine-acetaminophen (TYLENOL SINUS) 30-500 MG TABS tablet Take 1 tablet by mouth every 4 (four) hours as needed (for cold).    Historical Provider, MD    Family History No family history on file.  Social History Social History  Substance Use Topics  . Smoking status: Current Every Day Smoker    Packs/day: 0.50  . Smokeless tobacco: Never Used  . Alcohol use Yes     Allergies   Penicillins   Review of Systems Review of Systems  Constitutional: Negative for fever and unexpected weight change.  Gastrointestinal: Negative for constipation.       Neg for fecal incontinence  Genitourinary: Positive for dysuria and hematuria. Negative for difficulty urinating and flank pain.       Negative for urinary incontinence or retention  Musculoskeletal: Positive for back pain.  Skin: Positive for rash.  Neurological: Negative for weakness and numbness.       Negative for saddle paresthesias      Physical Exam Updated Vital Signs BP 117/79 (BP Location: Left Arm)   Pulse 67   Temp 97.9 F (36.6 C)   Resp 16   SpO2 99%   Physical Exam  Constitutional: He appears well-developed and well-nourished.  HENT:  Head: Normocephalic and atraumatic.  Eyes: Conjunctivae are normal.  Neck: Normal range of motion.  Abdominal: Soft. There is no tenderness. There is no CVA tenderness.  Genitourinary: Penis  normal. Right testis shows no swelling and no tenderness. Left testis shows no swelling and no tenderness. No discharge found.  Musculoskeletal: Normal range of motion. He exhibits no tenderness.  No step-off noted with palpation of spine. Pain is not reproduced with palpation over the lower back. States pain is better and worse in certain positions.   Neurological: He is alert. He has normal reflexes. No sensory deficit. He exhibits normal muscle tone.  5/5 strength in entire lower extremities bilaterally. No sensation deficit.   Skin: Skin is warm and dry.  Psychiatric: He has  a normal mood and affect.  Nursing note and vitals reviewed.    ED Treatments / Results  Labs (all labs ordered are listed, but only abnormal results are displayed) Labs Reviewed  URINALYSIS, ROUTINE W REFLEX MICROSCOPIC - Abnormal; Notable for the following:       Result Value   Hgb urine dipstick SMALL (*)    Ketones, ur 5 (*)    Squamous Epithelial / LPF 0-5 (*)    All other components within normal limits  GC/CHLAMYDIA PROBE AMP ([place]) NOT AT [facility]    EKG  EKG Interpretation None       Radiology No results found.  Procedures Procedures (including critical care time)  Medications Ordered in ED Medications - No data to display   Initial Impression / Assessment and Plan / ED Course  I have reviewed the triage vital signs and the nursing notes.  Pertinent labs & imaging results that were available during my care of the patient were reviewed by me and considered in my medical decision making (see chart for details).  Clinical Course    Patient seen and examined. Will check UA, urine GC/chlamydia.   Vital signs reviewed and are as follows: BP 117/79 (BP Location: Left Arm)   Pulse 67   Temp 97.9 F (36.6 C)   Resp 16   SpO2 99%   11:26 AM Patient informed of results. Will treat back pain symptomatically.   No red flag s/s of low back pain. Patient was counseled on back pain precautions and told to do activity as tolerated but do not lift, push, or pull heavy objects more than 10 pounds for the next week.  Patient counseled to use ice or heat on back for no longer than 15 minutes every hour.   Patient prescribed muscle relaxer and counseled on proper use of muscle relaxant medication.    Urged patient not to drink alcohol, drive, or perform any other activities that requires focus while taking either of these medications.  Patient urged to follow-up with PCP if pain does not improve with treatment and rest or if pain becomes recurrent. Urged to return  with worsening severe pain, loss of bowel or bladder control, trouble walking.   The patient verbalizes understanding and agrees with the plan.   Final Clinical Impressions(s) / ED Diagnoses   Final diagnoses:  Acute bilateral low back pain without sciatica  Hematuria, unspecified type   Back pain: Patient with back pain. No neurological deficits. Patient is ambulatory. No warning symptoms of back pain including: fecal incontinence, urinary retention or overflow incontinence, night sweats, waking from sleep with back pain, unexplained fevers or weight loss, h/o cancer, IVDU, recent trauma. No concern for cauda equina, epidural abscess, or other serious cause of back pain. Conservative measures such as rest, ice/heat and pain medicine indicated with PCP follow-up if no improvement with conservative management.   Hematuria: Resolved. F/u as needed.  GC/chlamydia pending.     New Prescriptions New Prescriptions   METHOCARBAMOL (ROBAXIN) 500 MG TABLET    Take 2 tablets (1,000 mg total) by mouth 4 (four) times daily.   NAPROXEN (NAPROSYN) 500 MG TABLET    Take 1 tablet (500 mg total) by mouth 2 (two) times daily.     Carlisle Cater, PA-C 10/29/16 1129    Fredia Sorrow, MD 10/29/16 [phone]

## 2016-10-29 NOTE — ED Notes (Addendum)
EDPA EVALUATED BEFORE THIS WRITER. SUPPLIES AT BEDSIDE. MADE THIS WRITER AWARE. URINE SAMPLE ONLY FOR RESULTS.

## 2016-10-29 NOTE — ED Triage Notes (Signed)
Per pt, states rash all over back which started a week ago-states back pain this am, states heavy lifting a work

## 2016-10-30 LAB — GC/CHLAMYDIA PROBE AMP (~~LOC~~) NOT AT ARMC
Chlamydia: NEGATIVE
Neisseria Gonorrhea: NEGATIVE

## 2018-05-17 ENCOUNTER — Encounter (HOSPITAL_COMMUNITY): Payer: Self-pay | Admitting: Emergency Medicine

## 2018-05-17 ENCOUNTER — Emergency Department (HOSPITAL_COMMUNITY)
Admission: EM | Admit: 2018-05-17 | Discharge: 2018-05-17 | Disposition: A | Discharge status: Home / Self Care | Payer: Self-pay | Attending: Emergency Medicine | Admitting: Emergency Medicine

## 2018-05-17 DIAGNOSIS — F17210 Nicotine dependence, cigarettes, uncomplicated: Secondary | ICD-10-CM | POA: Insufficient documentation

## 2018-05-17 DIAGNOSIS — K0889 Other specified disorders of teeth and supporting structures: Secondary | ICD-10-CM | POA: Insufficient documentation

## 2018-05-17 MED ORDER — TRAMADOL HCL 50 MG PO TABS: 50.0000 mg | ORAL_TABLET | Freq: Four times a day (QID) | ORAL | 0 refills | Status: DC | PRN

## 2018-05-17 MED ORDER — CLINDAMYCIN HCL 300 MG PO CAPS
300.0000 mg | ORAL_CAPSULE | Freq: Three times a day (TID) | ORAL | 0 refills | Status: DC
Start: 2018-05-17 — End: 2018-11-21

## 2018-05-17 NOTE — Discharge Instructions (Signed)
Take ibuprofen 600 mg every 6 hours as needed for pain as well. This is safe to take with tramadol and you'll get better pain relief. See dental resources. Take antibiotics until finished.

## 2018-05-17 NOTE — ED Triage Notes (Signed)
Pt reports has some pain in mouth and swelling that started on yesterday. Reports swelling got worse this morning.

## 2018-05-22 NOTE — ED Provider Notes (Signed)
[place] DEPT Provider Note   CSN: [id number] Arrival date & time: 05/17/18  1018     History   Chief Complaint Chief Complaint  Patient presents with  . Oral Swelling    HPI Nicholas Medina is a 42 y.o. male.  HPI  42 year old male with facial/dental pain.  Symptom onset a few days ago.  Initially a dull ache which has progressed.  He is now having some swelling towards his left cheek.  No fevers or chills.  No neck pain.  No difficulty swallowing or any acute respiratory complaints.  History reviewed. No pertinent past medical history.  Patient Active Problem List   Diagnosis Date Noted  . ALCOHOL ABUSE 12/19/2010  . COCAINE ABUSE 12/19/2010    History reviewed. No pertinent surgical history.      Home Medications    Prior to Admission medications   Medication Sig Start Date End Date Taking? Authorizing Provider  tetrahydrozoline-zinc (VISINE-AC) 0.05-0.25 % ophthalmic solution Place 2 drops into both eyes daily as needed (red eyes).    Yes [provider]  clindamycin (CLEOCIN) 300 MG capsule Take 1 capsule (300 mg total) by mouth 3 (three) times daily. 05/17/18   Virgel Manifold, MD  methocarbamol (ROBAXIN) 500 MG tablet Take 2 tablets (1,000 mg total) by mouth 4 (four) times daily. Patient not taking: Reported on 05/17/2018 10/29/16   Carlisle Cater, PA-C  naproxen (NAPROSYN) 500 MG tablet Take 1 tablet (500 mg total) by mouth 2 (two) times daily. Patient not taking: Reported on 05/17/2018 10/29/16   Carlisle Cater, PA-C  traMADol (ULTRAM) 50 MG tablet Take 1 tablet (50 mg total) by mouth every 6 (six) hours as needed. 05/17/18   Virgel Manifold, MD    Family History No family history on file.  Social History Social History   Tobacco Use  . Smoking status: Current Every Day Smoker    Packs/day: 0.50  . Smokeless tobacco: Never Used  Substance Use Topics  . Alcohol use: Yes  . Drug use: No    Types: Cocaine      Allergies   Penicillins   Review of Systems Review of Systems  All systems reviewed and negative, other than as noted in HPI.  Physical Exam Updated Vital Signs BP (!) 145/99 (BP Location: Right Arm)   Pulse 78   Temp 98.8 F (37.1 C) (Oral)   Resp 18   Ht 6' (1.829 m)   Wt 79.4 kg (175 lb)   SpO2 98%   BMI 23.73 kg/m   Physical Exam  Constitutional: He appears well-developed and well-nourished. No distress.  HENT:  Head: Normocephalic and atraumatic.  Very mild swelling of the left maxillary region.  No redness.  Poor dentition.  Tenderness to percussion left upper premolars.  No discrete drainable collection.  Oropharynx is otherwise clear.  Normal sounding voice.  Neck is supple.  Eyes: Conjunctivae are normal. Right eye exhibits no discharge. Left eye exhibits no discharge.  Neck: Neck supple.  Cardiovascular: Normal rate, regular rhythm and normal heart sounds. Exam reveals no gallop and no friction rub.  No murmur heard. Pulmonary/Chest: Effort normal and breath sounds normal. No respiratory distress.  Abdominal: Soft. He exhibits no distension. There is no tenderness.  Musculoskeletal: He exhibits no edema or tenderness.  Neurological: He is alert.  Skin: Skin is warm and dry.  Psychiatric: He has a normal mood and affect. His behavior is normal. Thought content normal.  Nursing note and vitals reviewed.  ED Treatments / Results  Labs (all labs ordered are listed, but only abnormal results are displayed) Labs Reviewed - No data to display  EKG None  Radiology No results found.  Procedures Procedures (including critical care time)  Medications Ordered in ED Medications - No data to display   Initial Impression / Assessment and Plan / ED Course  I have reviewed the triage vital signs and the nursing notes.  Pertinent labs & imaging results that were available during my care of the patient were reviewed by me and considered in my medical  decision making (see chart for details).    42 year old male with dental pain.  Suspect he is developing periapical abscess.  He has minimal facial swelling on exam.  No evidence of airway compromise.  Will place him on antibiotics.  He has a penicillin allergy.  Clindamycin prescribed.  Few pills of tramadol & advised to take ibuprofen with it.  Stressed the need for definitive dental follow-up.  List of dental resources was provided.  Final Clinical Impressions(s) / ED Diagnoses   Final diagnoses:  Pain, dental    ED Discharge Orders        Ordered    clindamycin (CLEOCIN) 300 MG capsule  3 times daily     05/17/18 1219    traMADol (ULTRAM) 50 MG tablet  Every 6 hours PRN     05/17/18 1219       Virgel Manifold, MD 05/22/18 1152

## 2018-11-21 ENCOUNTER — Encounter (HOSPITAL_COMMUNITY): Payer: Self-pay

## 2018-11-21 ENCOUNTER — Emergency Department (HOSPITAL_COMMUNITY): Payer: Self-pay

## 2018-11-21 ENCOUNTER — Emergency Department (HOSPITAL_COMMUNITY)
Admission: EM | Admit: 2018-11-21 | Discharge: 2018-11-21 | Disposition: A | Discharge status: Home / Self Care | Payer: Self-pay | Attending: Emergency Medicine | Admitting: Emergency Medicine

## 2018-11-21 ENCOUNTER — Other Ambulatory Visit: Payer: Self-pay

## 2018-11-21 DIAGNOSIS — R072 Precordial pain: Secondary | ICD-10-CM | POA: Insufficient documentation

## 2018-11-21 DIAGNOSIS — F17210 Nicotine dependence, cigarettes, uncomplicated: Secondary | ICD-10-CM | POA: Insufficient documentation

## 2018-11-21 LAB — I-STAT CHEM 8, ED
BUN: 10 mg/dL (ref 6–20)
CALCIUM ION: 1.18 mmol/L (ref 1.15–1.40)
CREATININE: 0.8 mg/dL (ref 0.61–1.24)
Chloride: 105 mmol/L (ref 98–111)
GLUCOSE: 82 mg/dL (ref 70–99)
HEMATOCRIT: 44 % (ref 39.0–52.0)
HEMOGLOBIN: 15 g/dL (ref 13.0–17.0)
POTASSIUM: 3.8 mmol/L (ref 3.5–5.1)
SODIUM: 138 mmol/L (ref 135–145)
TCO2: 24 mmol/L (ref 22–32)

## 2018-11-21 LAB — I-STAT TROPONIN, ED: Troponin i, poc: 0 ng/mL (ref 0.00–0.08)

## 2018-11-21 NOTE — ED Provider Notes (Signed)
[facility] DEPT Provider Note   CSN: [id number] Arrival date & time: 11/21/18  1010     History   Chief Complaint Chief Complaint  Patient presents with  . Generalized Body Aches    HPI Mikale Silversmith is a 43 y.o. male.  HPI Patient is a 43 year old male who developed having sharp chest pain into his right chest and down into his right abdomen with some extension into his right arm.  This started about an hour ago.  No associated diaphoresis or shortness of breath.  No nausea or vomiting.  Denies a history of coronary artery disease.  No fevers or chills.  No productive cough.  Patient states he was in his normal state of health when the sharp pain started.  No weakness or numbness of his arms or legs.  Denies a family history of early cardiac disease.   History reviewed. No pertinent past medical history.  Patient Active Problem List   Diagnosis Date Noted  . ALCOHOL ABUSE 12/19/2010  . COCAINE ABUSE 12/19/2010    History reviewed. No pertinent surgical history.      Home Medications    Prior to Admission medications   Not on File    Family History No family history on file.  Social History Social History   Tobacco Use  . Smoking status: Current Every Day Smoker    Packs/day: 0.50  . Smokeless tobacco: Never Used  Substance Use Topics  . Alcohol use: Yes  . Drug use: No    Types: Cocaine    Comment: 09/21/18 last use     Allergies   Penicillins   Review of Systems Review of Systems  All other systems reviewed and are negative.    Physical Exam Updated Vital Signs BP (!) 136/93   Pulse 73   Temp 98.4 F (36.9 C) (Oral)   Resp 16   Ht 5\' 11"  (1.803 m)   Wt 80.3 kg   SpO2 98%   BMI 24.69 kg/m   Physical Exam Vitals signs and nursing note reviewed.  Constitutional:      Appearance: He is well-developed.  HENT:     Head: Normocephalic and atraumatic.  Neck:     Musculoskeletal: Normal range of motion.    Cardiovascular:     Rate and Rhythm: Normal rate and regular rhythm.     Heart sounds: Normal heart sounds.  Pulmonary:     Effort: Pulmonary effort is normal. No respiratory distress.     Breath sounds: Normal breath sounds.  Abdominal:     General: There is no distension.     Palpations: Abdomen is soft.     Tenderness: There is no abdominal tenderness.  Musculoskeletal: Normal range of motion.     Comments: Normal radial pulses bilaterally.  Skin:    General: Skin is warm and dry.  Neurological:     Mental Status: He is alert and oriented to person, place, and time.  Psychiatric:        Judgment: Judgment normal.      ED Treatments / Results  Labs (all labs ordered are listed, but only abnormal results are displayed) Labs Reviewed  I-STAT CHEM 8, ED  I-STAT TROPONIN, ED    EKG EKG Interpretation  Date/Time:  Monday November 21 2018 12:05:20 EST Ventricular Rate:  73 PR Interval:  188 QRS Duration: 86 QT Interval:  378 QTC Calculation: 416 R Axis:   43 Text Interpretation:  Normal sinus rhythm Abnormal ECG Nonspecific ST  and T wave abnormality Confirmed by Jola Schmidt [phone]) on 11/21/2018 12:29:08 PM   Radiology Dg Chest 2 View  Result Date: 11/21/2018 CLINICAL DATA:  Chest pain radiating into the right arm today. EXAM: CHEST - 2 VIEW COMPARISON:  PA and lateral chest 10/16/2014. FINDINGS: The lungs are clear. Heart size is normal. No pneumothorax or pleural effusion. No acute or focal bony abnormality. IMPRESSION: Negative chest. Electronically Signed   By: Inge Rise M.D.   On: 11/21/2018 12:21    Procedures Procedures (including critical care time)  Medications Ordered in ED Medications - No data to display   Initial Impression / Assessment and Plan / ED Course  I have reviewed the triage vital signs and the nursing notes.  Pertinent labs & imaging results that were available during my care of the patient were reviewed by me and considered in  my medical decision making (see chart for details).     Atypical chest pain.  Doubt ACS.  Doubt dissection.  Doubt PE.  Resolution of symptoms in the emergency department.  Stable vital signs.  Patient encouraged to follow-up with her primary care physician.  He understands return to the emergency department for new or worsening symptoms.  I do not think he needs additional work-up at this time.  Atypical presentation  Final Clinical Impressions(s) / ED Diagnoses   Final diagnoses:  Precordial pain    ED Discharge Orders    None       Jola Schmidt, MD 11/21/18 1328

## 2018-11-21 NOTE — ED Notes (Signed)
Patient ambulated to XR.

## 2018-11-21 NOTE — ED Triage Notes (Signed)
Pt states that he is having sharp shooting pains going down his body, mostly on the left side. Pt ambulatory in triage. Pt states this started about an hour.

## 2018-12-16 ENCOUNTER — Encounter (HOSPITAL_COMMUNITY): Payer: Self-pay | Admitting: Oncology

## 2018-12-16 ENCOUNTER — Emergency Department (HOSPITAL_COMMUNITY)
Admission: EM | Admit: 2018-12-16 | Discharge: 2018-12-17 | Disposition: A | Discharge status: Home / Self Care | Payer: Self-pay | Attending: Emergency Medicine | Admitting: Emergency Medicine

## 2018-12-16 ENCOUNTER — Other Ambulatory Visit: Payer: Self-pay

## 2018-12-16 DIAGNOSIS — R4689 Other symptoms and signs involving appearance and behavior: Secondary | ICD-10-CM

## 2018-12-16 DIAGNOSIS — F10929 Alcohol use, unspecified with intoxication, unspecified: Secondary | ICD-10-CM | POA: Insufficient documentation

## 2018-12-16 DIAGNOSIS — F10921 Alcohol use, unspecified with intoxication delirium: Secondary | ICD-10-CM

## 2018-12-16 DIAGNOSIS — R569 Unspecified convulsions: Secondary | ICD-10-CM

## 2018-12-16 DIAGNOSIS — R456 Violent behavior: Secondary | ICD-10-CM | POA: Insufficient documentation

## 2018-12-16 DIAGNOSIS — G40909 Epilepsy, unspecified, not intractable, without status epilepticus: Secondary | ICD-10-CM | POA: Insufficient documentation

## 2018-12-16 DIAGNOSIS — F22 Delusional disorders: Secondary | ICD-10-CM | POA: Insufficient documentation

## 2018-12-16 DIAGNOSIS — F17210 Nicotine dependence, cigarettes, uncomplicated: Secondary | ICD-10-CM | POA: Insufficient documentation

## 2018-12-16 LAB — COMPREHENSIVE METABOLIC PANEL
ALT: 26 U/L (ref 0–44)
AST: 23 U/L (ref 15–41)
Albumin: 4.1 g/dL (ref 3.5–5.0)
Alkaline Phosphatase: 63 U/L (ref 38–126)
Anion gap: 10 (ref 5–15)
BUN: 9 mg/dL (ref 6–20)
CHLORIDE: 108 mmol/L (ref 98–111)
CO2: 26 mmol/L (ref 22–32)
Calcium: 9.5 mg/dL (ref 8.9–10.3)
Creatinine, Ser: 1 mg/dL (ref 0.61–1.24)
GFR calc Af Amer: >60 mL/min (ref >60)
GFR calc non Af Amer: >60 mL/min (ref >60)
Glucose, Bld: 112 mg/dL — ABNORMAL HIGH (ref 70–99)
Potassium: 3.9 mmol/L (ref 3.5–5.1)
SODIUM: 144 mmol/L (ref 135–145)
Total Bilirubin: 0.5 mg/dL (ref 0.3–1.2)
Total Protein: 8 g/dL (ref 6.5–8.1)

## 2018-12-16 LAB — CBC WITH DIFFERENTIAL/PLATELET
Abs Immature Granulocytes: 0.04 10*3/uL (ref 0.00–0.07)
Basophils Absolute: 0 10*3/uL (ref 0.0–0.1)
Basophils Relative: 0 %
Eosinophils Absolute: 0.2 10*3/uL (ref 0.0–0.5)
Eosinophils Relative: 2 %
HCT: 47.3 % (ref 39.0–52.0)
Hemoglobin: 16.4 g/dL (ref 13.0–17.0)
Immature Granulocytes: 0 %
Lymphocytes Relative: 42 %
Lymphs Abs: 3.9 10*3/uL (ref 0.7–4.0)
MCH: 28.6 pg (ref 26.0–34.0)
MCHC: 34.7 g/dL (ref 30.0–36.0)
MCV: 82.4 fL (ref 80.0–100.0)
Monocytes Absolute: 0.7 10*3/uL (ref 0.1–1.0)
Monocytes Relative: 7 %
NEUTROS ABS: 4.6 10*3/uL (ref 1.7–7.7)
Neutrophils Relative %: 49 %
Platelets: 283 10*3/uL (ref 150–400)
RBC: 5.74 MIL/uL (ref 4.22–5.81)
RDW: 12.7 % (ref 11.5–15.5)
WBC: 9.4 10*3/uL (ref 4.0–10.5)
nRBC: 0 % (ref 0.0–0.2)

## 2018-12-16 LAB — ETHANOL: Alcohol, Ethyl (B): 276 mg/dL — ABNORMAL HIGH (ref <10)

## 2018-12-16 MED ORDER — LORAZEPAM 2 MG/ML IJ SOLN: 0.0000 mg | Freq: Two times a day (BID) | INTRAMUSCULAR | Status: DC

## 2018-12-16 MED ORDER — ZIPRASIDONE MESYLATE 20 MG IM SOLR
20.0000 mg | Freq: Once | INTRAMUSCULAR | Status: AC
  Administered 2018-12-16: 20 mg via INTRAMUSCULAR
  Filled 2018-12-16: qty 20

## 2018-12-16 MED ORDER — LORAZEPAM 1 MG PO TABS: 0.0000 mg | ORAL_TABLET | Freq: Four times a day (QID) | ORAL | Status: DC

## 2018-12-16 MED ORDER — STERILE WATER FOR INJECTION IJ SOLN
INTRAMUSCULAR | Status: AC
  Administered 2018-12-16: 22:00:00
  Filled 2018-12-16: qty 10

## 2018-12-16 MED ORDER — VITAMIN B-1 100 MG PO TABS: 100.0000 mg | ORAL_TABLET | Freq: Every day | ORAL | Status: DC

## 2018-12-16 MED ORDER — LORAZEPAM 2 MG/ML IJ SOLN: 0.0000 mg | Freq: Four times a day (QID) | INTRAMUSCULAR | Status: DC

## 2018-12-16 MED ORDER — THIAMINE HCL 100 MG/ML IJ SOLN: 100.0000 mg | Freq: Every day | INTRAMUSCULAR | Status: DC

## 2018-12-16 MED ORDER — LORAZEPAM 1 MG PO TABS: 0.0000 mg | ORAL_TABLET | Freq: Two times a day (BID) | ORAL | Status: DC

## 2018-12-16 NOTE — ED Provider Notes (Signed)
[facility] EMERGENCY DEPARTMENT Provider Note   CSN: [id number] Arrival date & time: 12/16/18  2115     History   Chief Complaint Chief Complaint  Patient presents with  . Medical Clearance    HPI Nicholas Medina is a 43 y.o. male.  HPI Presents from home via EMS after an episode of seizure activity, allegedly, with combative behavior, possible intoxication per The patient himself is a poor historian, states that he does have a history of seizures, chooses not to take any medication because he has a gift, from God, that he feels will be taken away if he takes medication. He feels as though associated with his seizure disorder is inability to have extracorporal visions, fly outside of his body, and since his surroundings. Patient states that he has a special ability due to his relationship with God Patient is [facility], aggressive, tries to leave prior to evaluation. It is unclear if patient has any pain, as he is perseverant on his not needing any help.  Per EMS, no report of trauma. Level V caveat due to the patient's current state of agitation / possible intoxication.   History reviewed. No pertinent past medical history.  Patient Active Problem List   Diagnosis Date Noted  . ALCOHOL ABUSE 12/19/2010  . COCAINE ABUSE 12/19/2010    History reviewed. No pertinent surgical history.      Home Medications    Prior to Admission medications   Not on File    Family History History reviewed. No pertinent family history.  Social History Social History   Tobacco Use  . Smoking status: Current Every Day Smoker    Packs/day: 0.50  . Smokeless tobacco: Never Used  Substance Use Topics  . Alcohol use: Yes  . Drug use: No    Types: Cocaine    Comment: 09/21/18 last use     Allergies   Penicillins   Review of Systems Review of Systems  Unable to perform ROS: Other  Patient delusional, seemingly intoxicated, perseverant on his current situation,  denial of offers for discussion of his presentation.   Physical Exam Updated Vital Signs BP (!) 132/95 (BP Location: Right Arm)   Pulse 89   Resp 15   Ht 5\' 11"  (1.803 m)   Wt 80 kg   SpO2 96%   BMI 24.60 kg/m   Physical Exam Vitals signs and nursing note reviewed.  Constitutional:      Appearance: He is well-developed. He is diaphoretic.     Comments: Young male can clear, perseverant patterns  HENT:     Head: Normocephalic and atraumatic.  Eyes:     Conjunctiva/sclera: Conjunctivae normal.  Pulmonary:     Effort: Pulmonary effort is normal. No respiratory distress.     Breath sounds: No stridor.  Abdominal:     General: There is no distension.  Skin:    General: Skin is warm.  Neurological:     Mental Status: He is alert.     Coordination: Coordination normal.     Gait: Gait is intact.     Comments: Patient is a poor examining, does not follow commands reliably, but does move all extremities spontaneously, is unremarkable gait, has speech that is clear, brief, with no facial asymmetry.  Psychiatric:        Attention and Perception: He is inattentive. He perceives auditory hallucinations.        Mood and Affect: Mood is anxious. Affect is labile, blunt and angry.  Speech: Speech is rapid and pressured and tangential.        Behavior: Behavior is agitated, aggressive and combative.        Cognition and Memory: Cognition is impaired.      ED Treatments / Results  Labs (all labs ordered are listed, but only abnormal results are displayed) Labs Reviewed  COMPREHENSIVE METABOLIC PANEL - Abnormal; Notable for the following components:      Result Value   Glucose, Bld 112 (*)    All other components within normal limits  ETHANOL - Abnormal; Notable for the following components:   Alcohol, Ethyl (B) 276 (*)    All other components within normal limits  CBC WITH DIFFERENTIAL/PLATELET  URINALYSIS, ROUTINE W REFLEX MICROSCOPIC  RAPID URINE DRUG SCREEN, HOSP  PERFORMED    EKG None  Radiology No results found.  Procedures Procedures (including critical care time)  Medications Ordered in ED Medications  LORazepam (ATIVAN) injection 0-4 mg (0 mg Intravenous Not Given 12/16/18 2200)    Or  LORazepam (ATIVAN) tablet 0-4 mg ( Oral See Alternative 12/16/18 2200)  LORazepam (ATIVAN) injection 0-4 mg (has no administration in time range)    Or  LORazepam (ATIVAN) tablet 0-4 mg (has no administration in time range)  thiamine (VITAMIN B-1) tablet 100 mg (100 mg Oral Refused 12/16/18 2251)    Or  thiamine (B-1) injection 100 mg ( Intravenous See Alternative 12/16/18 2251)  ziprasidone (GEODON) injection 20 mg (20 mg Intramuscular Given 12/16/18 2145)  sterile water (preservative free) injection (  Given 12/16/18 2145)     Initial Impression / Assessment and Plan / ED Course  I have reviewed the triage vital signs and the nursing notes.  Pertinent labs & imaging results that were available during my care of the patient were reviewed by me and considered in my medical decision making (see chart for details).     11:26 PM Labs notable for alcohol level 276, critically high. With the patient's aggressiveness, labile behavior, concern for psychosis, the patient had involuntary commitment papers executed. When the patient is more sober, he will require additional eval with consideration of psychiatric evaluation - Dr. Ellender Hose is aware of the patient. No gross evidence for trauma or other medical findings on initial exam, and other labs generally reassuring.  Final Clinical Impressions(s) / ED Diagnoses  Delusions Seizure Aggressive behavior Acute alcohol intoxication   Carmin Muskrat, MD 12/16/18 2327

## 2018-12-16 NOTE — ED Notes (Signed)
Pt speaking in a demonic voice claiming to be the devil.

## 2018-12-16 NOTE — ED Notes (Signed)
All clothes removed except boxers and given to s/o.

## 2018-12-16 NOTE — ED Triage Notes (Signed)
Pt bib [facility] s/p witnessed seizures.  Fire reported approximately 3-4 seizures lasting approximately 6-10 minuted all together. EMS reported that pt was post ictal upon their arrival.  Pt has heavy ETOH on board.  En route EMS reported that pt became combative and began having hallucinations of the devil and speaking in a strange voice.  Pt was aggressive upon arrival.  Dr. Vanita Panda to room.  Pt given geodon w/ security assistance.  Pt reported his seizures are an out of body experience and declines to take medication for them as he believes they are a gift from God.

## 2018-12-16 NOTE — ED Notes (Signed)
Pt awake and combative.  Pulled out IV.  Removed all monitoring equipment.

## 2018-12-17 ENCOUNTER — Other Ambulatory Visit: Payer: Self-pay

## 2018-12-17 NOTE — ED Provider Notes (Signed)
1610: I evaluated pt during morning rounding.  Reviewed his ER visit. Briefly, pt brought to ER for combative behavior, possible intoxication and seizure activity.  H/o seizure non compliant of medications. Labs notable for ETOH 276.  IVC per Dr Vanita Panda due to labile behavior, aggressiveness and possible psychosis.  The plan was to re-evaluate to determine further medical and psych needs.  I spoke to [employer] states pt has been asleep throughout the night and not causing any issues.    Physical Exam  BP (!) 100/55 (BP Location: Right Arm)   Pulse 67   Temp 98.6 F (37 C) (Oral)   Resp 16   Ht 5\' 11"  (1.803 m)   Wt 80 kg   SpO2 96%   BMI 24.60 kg/m   Physical Exam Constitutional:      Appearance: He is well-developed. He is not toxic-appearing.     Comments: Asleep in bed but easily arousable. Cooperative with exam.   HENT:     Head: Normocephalic.     Comments: No facial, nasal, scalp bone tenderness.     Right Ear: External ear normal.     Left Ear: External ear normal.     Nose: Nose normal.     Mouth/Throat:     Comments: No intraoral or tongue injury. Eyes:     Conjunctiva/sclera: Conjunctivae normal.  Neck:     Musculoskeletal: Full passive range of motion without pain.  Cardiovascular:     Rate and Rhythm: Normal rate and regular rhythm.     Heart sounds: Normal heart sounds.  Pulmonary:     Effort: Pulmonary effort is normal. No tachypnea or respiratory distress.  Musculoskeletal: Normal range of motion.  Skin:    General: Skin is warm and dry.     Capillary Refill: Capillary refill takes less than 2 seconds.  Neurological:     Mental Status: He is alert and oriented to person, place, and time.     Comments:  Oriented to self, place, year but cannot remember why he is in the ER.   Gets out of bed easily with good coordination, normal and steady gait.  Strength 5/5 in upper and lower extremities   Sensation to light touch intact in bilateral face, upper and lower  extremities.   Visual fields not tested otherwise CN II-XII grossly intact bilaterally.   Psychiatric:        Behavior: Behavior normal.        Thought Content: Thought content normal.     Comments: Affect appears normal.  Cooperative, respectful, "thank you" and "please".  Denies SI, HI or AVH. States he got angry at someone because they complained about a job he did but never had a plan to hurt them or homicide.      ED Course/Procedures     Procedures  MDM   0745: VSS. Mood, affect appears normal to me. Denies SI, HI, AVH, pain.  Fully oriented.  Ambulatory in ER and tolerating PO, clinically sober.  Labs reviewed showing acute ETOH intoxication but otherwise WNL.  CT head considered due to AMS/ETOH and seizure however I do not see evidence of head trauma, neuro deficits today and will defer.  Pt has not taken any of his meds in several months which is the most likely cause of break through seizure.  I do not see how further labs like drug levels would be beneficial.  This is unlikely to be meningitis.  I don't see indication for further imaging or lab  work, admission. Discussed with Dr Ellender Hose and Dr Tomi Bamberger who will rescind IVC paperwork.  Discussed plan to discharge with patient and he is agreeable to this.      Kinnie Feil, PA-C 12/17/18 0800    Duffy Bruce, MD 12/18/18 [phone]

## 2018-12-17 NOTE — Discharge Instructions (Signed)
You were brought to ER after witnessed seizure activity and combativeness  Alcohol level was elevated  In the morning you denied suicide or homicidal thoughts, hallucinations or other medical concerns  You were deemed appropriate for discharge  Consider cutting back on alcohol intake  You declined any seizure medications  Return for severe headache, vision changes, recurrent or prolonged seizures, weakness or loss of strength and sensation to extremities, difficulty with speech or balance

## 2018-12-17 NOTE — ED Notes (Signed)
Breakfast tray ordered 

## 2018-12-17 NOTE — ED Notes (Signed)
IVC paperwork rescinded by Dr Hillard Danker - copy faxed to [facility], copy sent to Medical Records, and original placed in folder for Magistrate.

## 2018-12-17 NOTE — ED Notes (Addendum)
Ate 100% of breakfast. D/C instructions given and questions answered to satisfaction - male visitor w/pt - ALL belongings - 2 labeled belongings bags and 1 valuables envelope - returned to pt - Pt signed verifying all items present. Unable to sign signature pad d/ t pad missing. Voiced understanding of d/c instructions.

## 2018-12-17 NOTE — ED Notes (Signed)
Woke pt so may eat breakfast prior to being d/c'd. Sitter had attempted to wake pt prior.

## 2020-04-01 ENCOUNTER — Encounter (HOSPITAL_COMMUNITY): Payer: Self-pay

## 2020-04-01 ENCOUNTER — Emergency Department (HOSPITAL_COMMUNITY)
Admission: EM | Admit: 2020-04-01 | Discharge: 2020-04-01 | Disposition: A | Discharge status: Home / Self Care | Payer: Medicaid Other | Attending: Emergency Medicine | Admitting: Emergency Medicine

## 2020-04-01 ENCOUNTER — Emergency Department (HOSPITAL_COMMUNITY): Payer: Medicaid Other

## 2020-04-01 ENCOUNTER — Other Ambulatory Visit: Payer: Self-pay

## 2020-04-01 DIAGNOSIS — Z72820 Sleep deprivation: Secondary | ICD-10-CM | POA: Diagnosis not present

## 2020-04-01 DIAGNOSIS — R413 Other amnesia: Secondary | ICD-10-CM | POA: Diagnosis present

## 2020-04-01 DIAGNOSIS — R410 Disorientation, unspecified: Secondary | ICD-10-CM | POA: Diagnosis not present

## 2020-04-01 DIAGNOSIS — F17200 Nicotine dependence, unspecified, uncomplicated: Secondary | ICD-10-CM | POA: Insufficient documentation

## 2020-04-01 HISTORY — DX: Unspecified convulsions: R56.9

## 2020-04-01 NOTE — ED Triage Notes (Addendum)
Patient states when he woke this AM he could not remember anything for the first 30 minutes. Patient states a week ago he was "stuck in a dream." when he woke up. Patient states, "Something is going on with my brain.  Patient denies any blurred vision. Patient states he did have a couple of seizures in the past 2 months.  Patient also rported that he did use cocaine yesterday.

## 2020-04-01 NOTE — Discharge Instructions (Addendum)
Return here if you develop persistent memory loss, weakness to one side of your body, or any other problems

## 2020-04-01 NOTE — ED Notes (Signed)
Re-eval by Dr. Zenia Resides. Pt remains A/Ox4. Skin w/d/pink. Resp wnl, equal and non-labored. D/C'd to home with written and verbal instructions. Voiced understanding. Denies HA, dizziness, lightheadedness, blurred vision or any other symptoms at this time. Pt strongly advised to return to ED for any concerns.  Declined w/c. Gait steady. Ambulated out of ED indpendently and safely with belongings. NAD. No further complaints voiced.

## 2020-04-01 NOTE — ED Provider Notes (Signed)
[facility] DEPT Provider Note   CSN: [id number] Arrival date & time: 04/01/20  0805     History Chief Complaint  Patient presents with  . Memory Loss    Nicholas Medina is a 44 y.o. male.  44 year old male who presents after having period of confusion this morning when he first awoke.  States that he has had trouble sleeping for a long time and only gets about 2 to 3 hours of sleep.  He awoke today, he did not know why he was in could remember things immediate or past.  Symptoms gradual improved on their own.  Had no associated neurological features.  Did use cocaine and alcohol yesterday but states that this is not a regular occurrence.  Does not use any medications currently.  Feels back to his baseline        Past Medical History:  Diagnosis Date  . Seizures [facility])     Patient Active Problem List   Diagnosis Date Noted  . ALCOHOL ABUSE 12/19/2010  . COCAINE ABUSE 12/19/2010    No past surgical history on file.     Family History  Family history unknown: Yes    Social History   Tobacco Use  . Smoking status: Current Every Day Smoker    Packs/day: 0.50  . Smokeless tobacco: Never Used  Substance Use Topics  . Alcohol use: Yes  . Drug use: Yes    Types: Cocaine    Comment: yesterday 04/01/19    Home Medications Prior to Admission medications   Not on File    Allergies    Penicillins  Review of Systems   Review of Systems  All other systems reviewed and are negative.   Physical Exam Updated Vital Signs BP 127/80 (BP Location: Left Arm)   Pulse 80   Temp 98.6 F (37 C) (Oral)   Resp 17   Ht 1.778 m (5\' 10" )   Wt 86.2 kg   SpO2 97%   BMI 27.26 kg/m   Physical Exam Vitals and nursing note reviewed.  Constitutional:      General: He is not in acute distress.    Appearance: Normal appearance. He is well-developed. He is not toxic-appearing.  HENT:     Head: Normocephalic and atraumatic.  Eyes:     General:  Lids are normal.     Conjunctiva/sclera: Conjunctivae normal.     Pupils: Pupils are equal, round, and reactive to light.  Neck:     Thyroid: No thyroid mass.     Trachea: No tracheal deviation.  Cardiovascular:     Rate and Rhythm: Normal rate and regular rhythm.     Heart sounds: Normal heart sounds. No murmur. No gallop.   Pulmonary:     Effort: Pulmonary effort is normal. No respiratory distress.     Breath sounds: Normal breath sounds. No stridor. No decreased breath sounds, wheezing, rhonchi or rales.  Abdominal:     General: Bowel sounds are normal. There is no distension.     Palpations: Abdomen is soft.     Tenderness: There is no abdominal tenderness. There is no rebound.  Musculoskeletal:        General: No tenderness. Normal range of motion.     Cervical back: Normal range of motion and neck supple.  Skin:    General: Skin is warm and dry.     Findings: No abrasion or rash.  Neurological:     Mental Status: He is alert and oriented to person,  place, and time.     GCS: GCS eye subscore is 4. GCS verbal subscore is 5. GCS motor subscore is 6.     Cranial Nerves: No cranial nerve deficit.     Sensory: No sensory deficit.     Motor: No weakness or tremor.     Gait: Gait and tandem walk normal.  Psychiatric:        Speech: Speech normal.        Behavior: Behavior normal.     ED Results / Procedures / Treatments   Labs (all labs ordered are listed, but only abnormal results are displayed) Labs Reviewed - No data to display  EKG EKG Interpretation  Date/Time:  Monday Apr 01 2020 10:02:34 EDT Ventricular Rate:  66 PR Interval:    QRS Duration: 98 QT Interval:  388 QTC Calculation: 407 R Axis:   63 Text Interpretation: Sinus rhythm Prolonged PR interval ST elev, probable normal early repol pattern No significant change since last tracing Confirmed by Lacretia Leigh ([id number]) on 04/01/2020 10:28:58 AM   Radiology No results found.  Procedures Procedures  (including critical care time)  Medications Ordered in ED Medications - No data to display  ED Course  I have reviewed the triage vital signs and the nursing notes.  Pertinent labs & imaging results that were available during my care of the patient were reviewed by me and considered in my medical decision making (see chart for details).    MDM Rules/Calculators/A&P                      Head CT negative here.  EKG is reassuring.  Discharged with referral to community wellness center Final Clinical Impression(s) / ED Diagnoses Final diagnoses:  None    Rx / DC Orders ED Discharge Orders    None       Lacretia Leigh, MD 04/01/20 1035

## 2020-06-13 ENCOUNTER — Encounter (HOSPITAL_COMMUNITY): Payer: Self-pay | Admitting: Emergency Medicine

## 2020-06-13 ENCOUNTER — Emergency Department (HOSPITAL_COMMUNITY)
Admission: EM | Admit: 2020-06-13 | Discharge: 2020-06-13 | Disposition: A | Discharge status: Home / Self Care | Payer: Medicaid Other | Attending: Emergency Medicine | Admitting: Emergency Medicine

## 2020-06-13 DIAGNOSIS — Z5321 Procedure and treatment not carried out due to patient leaving prior to being seen by health care provider: Secondary | ICD-10-CM | POA: Insufficient documentation

## 2020-06-13 DIAGNOSIS — T63441A Toxic effect of venom of bees, accidental (unintentional), initial encounter: Secondary | ICD-10-CM | POA: Insufficient documentation

## 2020-06-13 NOTE — ED Notes (Signed)
Per registration, patient turned in labels and reports he is leaving. 

## 2020-06-13 NOTE — ED Triage Notes (Signed)
Patient reports stung by bees to bilateral arms, head, and neck while mowing the yard today. Reports pain all over. Denies throat swelling and SOB. Speaking in full sentences without difficulty.

## 2021-04-12 ENCOUNTER — Encounter (HOSPITAL_COMMUNITY): Payer: Self-pay

## 2021-04-12 ENCOUNTER — Other Ambulatory Visit: Payer: Self-pay

## 2021-04-12 ENCOUNTER — Emergency Department (HOSPITAL_COMMUNITY)
Admission: EM | Admit: 2021-04-12 | Discharge: 2021-04-12 | Disposition: A | Discharge status: Home / Self Care | Payer: Medicaid Other | Attending: Emergency Medicine | Admitting: Emergency Medicine

## 2021-04-12 ENCOUNTER — Emergency Department (HOSPITAL_COMMUNITY): Payer: Medicaid Other

## 2021-04-12 DIAGNOSIS — W57XXXA Bitten or stung by nonvenomous insect and other nonvenomous arthropods, initial encounter: Secondary | ICD-10-CM | POA: Insufficient documentation

## 2021-04-12 DIAGNOSIS — F10920 Alcohol use, unspecified with intoxication, uncomplicated: Secondary | ICD-10-CM

## 2021-04-12 DIAGNOSIS — F10129 Alcohol abuse with intoxication, unspecified: Secondary | ICD-10-CM | POA: Diagnosis not present

## 2021-04-12 DIAGNOSIS — W19XXXA Unspecified fall, initial encounter: Secondary | ICD-10-CM

## 2021-04-12 DIAGNOSIS — S00262A Insect bite (nonvenomous) of left eyelid and periocular area, initial encounter: Secondary | ICD-10-CM | POA: Insufficient documentation

## 2021-04-12 DIAGNOSIS — F17200 Nicotine dependence, unspecified, uncomplicated: Secondary | ICD-10-CM | POA: Insufficient documentation

## 2021-04-12 LAB — CBC WITH DIFFERENTIAL/PLATELET
Abs Immature Granulocytes: 0.06 10*3/uL (ref 0.00–0.07)
Basophils Absolute: 0 10*3/uL (ref 0.0–0.1)
Basophils Relative: 0 %
Eosinophils Absolute: 0 10*3/uL (ref 0.0–0.5)
Eosinophils Relative: 0 %
HCT: 47.6 % (ref 39.0–52.0)
Hemoglobin: 16.7 g/dL (ref 13.0–17.0)
Immature Granulocytes: 1 %
Lymphocytes Relative: 36 %
Lymphs Abs: 3.2 10*3/uL (ref 0.7–4.0)
MCH: 29.6 pg (ref 26.0–34.0)
MCHC: 35.1 g/dL (ref 30.0–36.0)
MCV: 84.2 fL (ref 80.0–100.0)
Monocytes Absolute: 0.7 10*3/uL (ref 0.1–1.0)
Monocytes Relative: 8 %
Neutro Abs: 5 10*3/uL (ref 1.7–7.7)
Neutrophils Relative %: 55 %
Platelets: 258 10*3/uL (ref 150–400)
RBC: 5.65 MIL/uL (ref 4.22–5.81)
RDW: 13 % (ref 11.5–15.5)
WBC: 9 10*3/uL (ref 4.0–10.5)
nRBC: 0 % (ref 0.0–0.2)

## 2021-04-12 LAB — COMPREHENSIVE METABOLIC PANEL
ALT: 24 U/L (ref 0–44)
AST: 22 U/L (ref 15–41)
Albumin: 3.9 g/dL (ref 3.5–5.0)
Alkaline Phosphatase: 57 U/L (ref 38–126)
Anion gap: 13 (ref 5–15)
BUN: 12 mg/dL (ref 6–20)
CO2: 23 mmol/L (ref 22–32)
Calcium: 9 mg/dL (ref 8.9–10.3)
Chloride: 104 mmol/L (ref 98–111)
Creatinine, Ser: 1.27 mg/dL — ABNORMAL HIGH (ref 0.61–1.24)
GFR, Estimated: >60 mL/min (ref >60)
Glucose, Bld: 147 mg/dL — ABNORMAL HIGH (ref 70–99)
Potassium: 3.1 mmol/L — ABNORMAL LOW (ref 3.5–5.1)
Sodium: 140 mmol/L (ref 135–145)
Total Bilirubin: 1 mg/dL (ref 0.3–1.2)
Total Protein: 7.4 g/dL (ref 6.5–8.1)

## 2021-04-12 LAB — ETHANOL: Alcohol, Ethyl (B): 150 mg/dL — ABNORMAL HIGH (ref <10)

## 2021-04-12 MED ORDER — SODIUM CHLORIDE 0.9 % IV BOLUS
500.0000 mL | Freq: Once | INTRAVENOUS | Status: AC
  Administered 2021-04-12: 500 mL via INTRAVENOUS

## 2021-04-12 MED ORDER — SODIUM CHLORIDE 0.9 % IV BOLUS
1000.0000 mL | Freq: Once | INTRAVENOUS | Status: AC
Start: 2021-04-12 — End: 2021-04-12
  Administered 2021-04-12: 1000 mL via INTRAVENOUS

## 2021-04-12 MED ORDER — SODIUM CHLORIDE 0.9 % IV BOLUS
1000.0000 mL | Freq: Once | INTRAVENOUS | Status: AC
  Administered 2021-04-12: 1000 mL via INTRAVENOUS

## 2021-04-12 MED ORDER — ONDANSETRON HCL 4 MG/2ML IJ SOLN
4.0000 mg | Freq: Once | INTRAMUSCULAR | Status: AC
  Administered 2021-04-12: 4 mg via INTRAVENOUS
  Filled 2021-04-12: qty 2

## 2021-04-12 NOTE — ED Triage Notes (Signed)
Patient coming from home with c/o being stung bee at around 4 pm. Patient was given benadryl 50 mg IV Patient had some cocaine around 2pm and alocohol today.

## 2021-04-12 NOTE — ED Notes (Signed)
Patient said he thinks he undercooked his ribs today while grilling out which is why he was vomiting. RN notified Dr. Joya Gaskins.

## 2021-04-12 NOTE — ED Provider Notes (Signed)
[facility] DEPT Provider Note   CSN: [id number] Arrival date & time: 04/12/21  1708     History No chief complaint on file.   Nicholas Medina is a 45 y.o. male.  EMS states the patient's family called.  He was stung on the left thigh by some sort of insect.  He has also been drinking heavily and possibly using cocaine.  He received IV fluid and Benadryl per EMS.  The history is provided by the EMS personnel. The history is limited by the condition of the patient (Mr. Elison appears intoxicated.).  Allergic Reaction Presenting symptoms: swelling (left upper eyelid)   Presenting symptoms: no rash   Severity:  Mild Prior allergic episodes:  Unable to specify Context: insect bite/sting   Relieved by:  Nothing Worsened by:  Nothing Ineffective treatments:  Antihistamines      Past Medical History:  Diagnosis Date  . Seizures [facility])     Patient Active Problem List   Diagnosis Date Noted  . ALCOHOL ABUSE 12/19/2010  . COCAINE ABUSE 12/19/2010    No past surgical history on file.     Family History  Family history unknown: Yes    Social History   Tobacco Use  . Smoking status: Current Every Day Smoker    Packs/day: 0.50  . Smokeless tobacco: Never Used  Vaping Use  . Vaping Use: Never used  Substance Use Topics  . Alcohol use: Yes  . Drug use: Yes    Types: Cocaine    Comment: yesterday 04/01/19    Home Medications Prior to Admission medications   Not on File    Allergies    Penicillins  Review of Systems   Review of Systems  Constitutional: Negative for chills and fever.  HENT: Negative for ear pain and sore throat.   Eyes: Negative for pain and visual disturbance.  Respiratory: Negative for cough and shortness of breath.   Cardiovascular: Negative for chest pain and palpitations.  Gastrointestinal: Negative for abdominal pain and vomiting.  Genitourinary: Negative for dysuria and hematuria.  Musculoskeletal: Negative for  arthralgias and back pain.  Skin: Negative for color change and rash.  Neurological: Negative for seizures and syncope.  All other systems reviewed and are negative.   Physical Exam Updated Vital Signs There were no vitals taken for this visit.  Physical Exam Vitals and nursing note reviewed.  Constitutional:      Appearance: He is well-developed.     Comments: Appears intoxicated, but he was able to ambulate from the EMS stretcher to the bed.  No acute distress.  HENT:     Head: Normocephalic and atraumatic.  Eyes:     Conjunctiva/sclera: Conjunctivae normal.     Comments: Minimal swelling to the left superior orbital ridge and left upper eyelid.  Cardiovascular:     Rate and Rhythm: Normal rate and regular rhythm.     Heart sounds: No murmur heard.   Pulmonary:     Effort: Pulmonary effort is normal. No respiratory distress.     Breath sounds: Normal breath sounds.  Musculoskeletal:     Cervical back: Neck supple.  Skin:    General: Skin is warm and dry.  Neurological:     General: No focal deficit present.     Mental Status: He is alert.     GCS: GCS eye subscore is 3. GCS verbal subscore is 5. GCS motor subscore is 6.     Gait: Gait normal.     ED Results /  Procedures / Treatments   Labs (all labs ordered are listed, but only abnormal results are displayed) Labs Reviewed  COMPREHENSIVE METABOLIC PANEL - Abnormal; Notable for the following components:      Result Value   Potassium 3.1 (*)    Glucose, Bld 147 (*)    Creatinine, Ser 1.27 (*)    All other components within normal limits  ETHANOL - Abnormal; Notable for the following components:   Alcohol, Ethyl (B) 150 (*)    All other components within normal limits  CBC WITH DIFFERENTIAL/PLATELET    EKG None  Radiology CT Head Wo Contrast  Result Date: 04/12/2021 CLINICAL DATA:  Head trauma, altered mental status. EXAM: CT HEAD WITHOUT CONTRAST TECHNIQUE: Contiguous axial images were obtained from the  base of the skull through the vertex without intravenous contrast. COMPARISON:  Head CT dated 04/01/2020 FINDINGS: Brain: Ventricles are stable in size. There is no mass, hemorrhage, edema or other evidence of acute parenchymal abnormality. No extra-axial hemorrhage. Vascular: No hyperdense vessel or unexpected calcification. Skull: Normal. Negative for fracture or focal lesion. Sinuses/Orbits: No acute findings. Other: None. IMPRESSION: Negative head CT. No intracranial mass, hemorrhage or edema. No skull fracture. Electronically Signed   By: Franki Cabot M.D.   On: 04/12/2021 20:06    Procedures Procedures   Medications Ordered in ED Medications  sodium chloride 0.9 % bolus 1,000 mL (0 mLs Intravenous Stopped 04/12/21 2126)  ondansetron (ZOFRAN) injection 4 mg (4 mg Intravenous Given 04/12/21 1945)  sodium chloride 0.9 % bolus 1,000 mL (0 mLs Intravenous Stopped 04/12/21 2125)  sodium chloride 0.9 % bolus 500 mL (500 mLs Intravenous New Bag/Given 04/12/21 2126)    ED Course  I have reviewed the triage vital signs and the nursing notes.  Pertinent labs & imaging results that were available during my care of the patient were reviewed by me and considered in my medical decision making (see chart for details).  Clinical Course as of 04/12/21 1940  Sat Apr 12, 2021  1856 I spoke with the patient's sister who is now at bedside.  She states that the patient fell after being stung by the insect, and he hit his head.  At this point, I have decided to order a CT scan of his head. [AW]    Clinical Course User Index [AW] Arnaldo Natal, MD   MDM Rules/Calculators/A&P                          Loreta Ave presented via EMS after he was stung by an insect, fell, and found lying on his bed at home.  He was found to be intoxicated.  He was intermittently hypotensive and was given IV fluid boluses.  His insect bite did not lead to anaphylaxis or other acute complications.  Head CT was obtained and was negative  for trauma.  Sister was at bedside and could ensure safety home.  He was given information and resources about alcohol use disorder. Final Clinical Impression(s) / ED Diagnoses Final diagnoses:  Insect bite of left eyelid, initial encounter  Alcoholic intoxication without complication ([place])  Fall, initial encounter    Rx / DC Orders ED Discharge Orders    None       Arnaldo Natal, MD 04/12/21 2155

## 2021-05-18 ENCOUNTER — Emergency Department (HOSPITAL_COMMUNITY)
Admission: EM | Admit: 2021-05-18 | Discharge: 2021-05-18 | Disposition: A | Discharge status: Home / Self Care | Payer: Medicaid Other | Attending: Emergency Medicine | Admitting: Emergency Medicine

## 2021-05-18 ENCOUNTER — Emergency Department (HOSPITAL_COMMUNITY): Payer: Medicaid Other

## 2021-05-18 ENCOUNTER — Encounter (HOSPITAL_COMMUNITY): Payer: Self-pay | Admitting: Emergency Medicine

## 2021-05-18 ENCOUNTER — Other Ambulatory Visit: Payer: Self-pay

## 2021-05-18 DIAGNOSIS — R0689 Other abnormalities of breathing: Secondary | ICD-10-CM | POA: Insufficient documentation

## 2021-05-18 DIAGNOSIS — R1011 Right upper quadrant pain: Secondary | ICD-10-CM | POA: Insufficient documentation

## 2021-05-18 DIAGNOSIS — F17210 Nicotine dependence, cigarettes, uncomplicated: Secondary | ICD-10-CM | POA: Insufficient documentation

## 2021-05-18 DIAGNOSIS — R1010 Upper abdominal pain, unspecified: Secondary | ICD-10-CM

## 2021-05-18 DIAGNOSIS — M25511 Pain in right shoulder: Secondary | ICD-10-CM | POA: Insufficient documentation

## 2021-05-18 LAB — URINALYSIS, ROUTINE W REFLEX MICROSCOPIC
Bacteria, UA: NONE SEEN
Bilirubin Urine: NEGATIVE
Glucose, UA: NEGATIVE mg/dL
Ketones, ur: NEGATIVE mg/dL
Leukocytes,Ua: NEGATIVE
Nitrite: NEGATIVE
Protein, ur: NEGATIVE mg/dL
Specific Gravity, Urine: 1.021 (ref 1.005–1.030)
pH: 5 (ref 5.0–8.0)

## 2021-05-18 LAB — CBC WITH DIFFERENTIAL/PLATELET
Abs Immature Granulocytes: 0.02 10*3/uL (ref 0.00–0.07)
Basophils Absolute: 0.1 10*3/uL (ref 0.0–0.1)
Basophils Relative: 1 %
Eosinophils Absolute: 0.3 10*3/uL (ref 0.0–0.5)
Eosinophils Relative: 4 %
HCT: 41.7 % (ref 39.0–52.0)
Hemoglobin: 15 g/dL (ref 13.0–17.0)
Immature Granulocytes: 0 %
Lymphocytes Relative: 22 %
Lymphs Abs: 2 10*3/uL (ref 0.7–4.0)
MCH: 30.1 pg (ref 26.0–34.0)
MCHC: 36 g/dL (ref 30.0–36.0)
MCV: 83.6 fL (ref 80.0–100.0)
Monocytes Absolute: 0.9 10*3/uL (ref 0.1–1.0)
Monocytes Relative: 10 %
Neutro Abs: 5.9 10*3/uL (ref 1.7–7.7)
Neutrophils Relative %: 63 %
Platelets: 276 10*3/uL (ref 150–400)
RBC: 4.99 MIL/uL (ref 4.22–5.81)
RDW: 13.1 % (ref 11.5–15.5)
WBC: 9.1 10*3/uL (ref 4.0–10.5)
nRBC: 0 % (ref 0.0–0.2)

## 2021-05-18 LAB — COMPREHENSIVE METABOLIC PANEL
ALT: 25 U/L (ref 0–44)
AST: 28 U/L (ref 15–41)
Albumin: 3.5 g/dL (ref 3.5–5.0)
Alkaline Phosphatase: 58 U/L (ref 38–126)
Anion gap: 7 (ref 5–15)
BUN: 11 mg/dL (ref 6–20)
CO2: 25 mmol/L (ref 22–32)
Calcium: 8.9 mg/dL (ref 8.9–10.3)
Chloride: 106 mmol/L (ref 98–111)
Creatinine, Ser: 0.94 mg/dL (ref 0.61–1.24)
GFR, Estimated: >60 mL/min (ref >60)
Glucose, Bld: 85 mg/dL (ref 70–99)
Potassium: 3.5 mmol/L (ref 3.5–5.1)
Sodium: 138 mmol/L (ref 135–145)
Total Bilirubin: 0.9 mg/dL (ref 0.3–1.2)
Total Protein: 7.1 g/dL (ref 6.5–8.1)

## 2021-05-18 LAB — TROPONIN I (HIGH SENSITIVITY): Troponin I (High Sensitivity): 3 ng/L (ref <18)

## 2021-05-18 LAB — LIPASE, BLOOD: Lipase: 26 U/L (ref 11–51)

## 2021-05-18 NOTE — Discharge Instructions (Addendum)
Please continue to monitor your symptoms closely.  If they worsen please come back to the emergency department.  It was a pleasure to meet you.

## 2021-05-18 NOTE — ED Provider Notes (Signed)
[facility] DEPT Provider Note   CSN: [id number] Arrival date & time: 05/18/21  8242     History Chief Complaint  Patient presents with   Abdominal Pain    Nicholas Medina is a 45 y.o. male.  HPI Patient is a 45 year old male with a medical history as noted below.  He presents to the emergency department due to right upper quadrant abdominal pain.  States this started around 3 AM and woke him up from sleep.  States that his pain was worsening with deep breathing and was 7/10 at its worst.  He states that his pain was intermittent initially and has since resolved.  Denies any chest pain or shortness of breath.  He states when his pain would worsen it would radiate into his right shoulder.  No nausea, vomiting, diarrhea.  No history of similar symptoms.  Denies a surgical history to his abdomen.    Past Medical History:  Diagnosis Date   Seizures [facility])     Patient Active Problem List   Diagnosis Date Noted   ALCOHOL ABUSE 12/19/2010   COCAINE ABUSE 12/19/2010    History reviewed. No pertinent surgical history.     Family History  Family history unknown: Yes    Social History   Tobacco Use   Smoking status: Every Day    Packs/day: 0.50    Pack years: 0.00    Types: Cigarettes   Smokeless tobacco: Never  Vaping Use   Vaping Use: Never used  Substance Use Topics   Alcohol use: Yes   Drug use: Yes    Types: Cocaine    Comment: yesterday 04/01/19    Home Medications Prior to Admission medications   Not on File    Allergies    Penicillins  Review of Systems   Review of Systems  All other systems reviewed and are negative. Ten systems reviewed and are negative for acute change, except as noted in the HPI.   Physical Exam Updated Vital Signs BP 122/86 (BP Location: Right Arm)   Pulse 66   Temp 98 F (36.7 C) (Oral)   Resp 12   Ht 5\' 10"  (1.778 m)   Wt 81.6 kg   SpO2 100%   BMI 25.83 kg/m   Physical Exam Vitals and  nursing note reviewed.  Constitutional:      General: He is not in acute distress.    Appearance: Normal appearance. He is not ill-appearing, toxic-appearing or diaphoretic.  HENT:     Head: Normocephalic and atraumatic.     Right Ear: External ear normal.     Left Ear: External ear normal.     Nose: Nose normal.     Mouth/Throat:     Mouth: Mucous membranes are moist.     Pharynx: Oropharynx is clear. No oropharyngeal exudate or posterior oropharyngeal erythema.  Eyes:     Extraocular Movements: Extraocular movements intact.  Cardiovascular:     Rate and Rhythm: Normal rate and regular rhythm.     Pulses: Normal pulses.     Heart sounds: Normal heart sounds. No murmur heard.   No friction rub. No gallop.  Pulmonary:     Effort: Pulmonary effort is normal. No respiratory distress.     Breath sounds: Normal breath sounds. No stridor. No wheezing, rhonchi or rales.  Abdominal:     General: Abdomen is flat.     Palpations: Abdomen is soft.     Tenderness: There is no abdominal tenderness. There is no right  CVA tenderness or left CVA tenderness.     Comments: Abdomen is flat, soft, and nontender in all 4 quadrants.  Negative Murphy sign.  No McBurney's point tenderness.  Musculoskeletal:        General: Normal range of motion.     Cervical back: Normal range of motion and neck supple. No tenderness.  Skin:    General: Skin is warm and dry.  Neurological:     General: No focal deficit present.     Mental Status: He is alert and oriented to person, place, and time.  Psychiatric:        Mood and Affect: Mood normal.        Behavior: Behavior normal.    ED Results / Procedures / Treatments   Labs (all labs ordered are listed, but only abnormal results are displayed) Labs Reviewed  URINALYSIS, ROUTINE W REFLEX MICROSCOPIC - Abnormal; Notable for the following components:      Result Value   Hgb urine dipstick SMALL (*)    All other components within normal limits  COMPREHENSIVE  METABOLIC PANEL  CBC WITH DIFFERENTIAL/PLATELET  LIPASE, BLOOD  TROPONIN I (HIGH SENSITIVITY)    EKG EKG Interpretation  Date/Time:  Sunday May 18 2021 07:47:48 EDT Ventricular Rate:  65 PR Interval:  213 QRS Duration: 99 QT Interval:  404 QTC Calculation: 420 R Axis:   56 Text Interpretation: Sinus rhythm Prolonged PR interval Confirmed by Milton Ferguson [phone]) on 05/18/2021 11:37:13 AM  Radiology DG Chest Portable 1 View  Result Date: 05/18/2021 CLINICAL DATA:  Chest pain on the right EXAM: PORTABLE CHEST 1 VIEW COMPARISON:  11/21/2018 FINDINGS: Artifact from EKG leads. A few emphysematous spaces are seen on the left. There is no edema, consolidation, effusion, or pneumothorax. Normal heart size and mediastinal contours. IMPRESSION: No evidence of active disease. Electronically Signed   By: Monte Fantasia M.D.   On: 05/18/2021 08:17    Procedures Procedures   Medications Ordered in ED Medications - No data to display  ED Course  I have reviewed the triage vital signs and the nursing notes.  Pertinent labs & imaging results that were available during my care of the patient were reviewed by me and considered in my medical decision making (see chart for details).    MDM Rules/Calculators/A&P                          Pt is a 45 y.o. male who presents to the emergency department with right upper abdominal pain that started this AM. Pain has since resolved.   Labs: CBC without abnormalities. CMP without abnormalities. Lipase within normal limits at 26. Troponin of 3. UA with small hemoglobin.  Imaging: Chest x-ray is negative.  ECG: Shows sinus rhythm with a prolonged PR interval.  I, Rayna Sexton, PA-C, personally reviewed and evaluated these images and lab results as part of my medical decision-making.  Patient's lab work today is all reassuring.  CBC, CMP, lipase were all within normal limits.  Unsure of the source of the patient's symptoms.  Possibly  symptomatic cholelithiasis but patient's pain has resolved since arrival.  No abdominal pain noted on my exam.  Negative Murphy sign.  He did note pain in his right shoulder when this occurred which would be consistent with this.  Denies any chest pain or shortness of breath.  No tachycardia or hypoxia noted since arrival.  No leg swelling.  Doubt DVT/PE.  Reassuring ECG, chest x-ray,  as well as troponin.  Doubt ACS.  Feel he is stable for discharge at this time and he is agreeable.  Recommended PCP follow-up and return to the emergency department if his symptoms should worsen.  Patient's questions were answered and he was amicable at the time of discharge.  Note: Portions of this report may have been transcribed using voice recognition software. Every effort was made to ensure accuracy; however, inadvertent computerized transcription errors may be present.   Final Clinical Impression(s) / ED Diagnoses Final diagnoses:  Pain of upper abdomen   Rx / DC Orders ED Discharge Orders     None        Rayna Sexton, PA-C 05/18/21 1140    Milton Ferguson, MD 05/18/21 1653

## 2021-05-18 NOTE — ED Triage Notes (Signed)
Patient has intermittent pain in his 'right lung,' appears to be upper R abdominal area, pain with inhalation since 3 am this morning. Denies N/V/D, fevers. Reports new non-productive cough. Denies recent trauma to the area.

## 2021-06-10 ENCOUNTER — Encounter (HOSPITAL_COMMUNITY): Payer: Self-pay | Admitting: Emergency Medicine

## 2021-06-10 ENCOUNTER — Emergency Department (HOSPITAL_COMMUNITY)
Admission: EM | Admit: 2021-06-10 | Discharge: 2021-06-10 | Disposition: A | Discharge status: Home / Self Care | Payer: Medicaid Other | Attending: Emergency Medicine | Admitting: Emergency Medicine

## 2021-06-10 DIAGNOSIS — T7840XA Allergy, unspecified, initial encounter: Secondary | ICD-10-CM | POA: Insufficient documentation

## 2021-06-10 DIAGNOSIS — F17210 Nicotine dependence, cigarettes, uncomplicated: Secondary | ICD-10-CM | POA: Diagnosis not present

## 2021-06-10 DIAGNOSIS — R21 Rash and other nonspecific skin eruption: Secondary | ICD-10-CM | POA: Diagnosis present

## 2021-06-10 MED ORDER — PREDNISONE 10 MG PO TABS: 40.0000 mg | ORAL_TABLET | Freq: Every day | ORAL | 0 refills | Status: AC

## 2021-06-10 MED ORDER — DIPHENHYDRAMINE HCL 50 MG/ML IJ SOLN
25.0000 mg | Freq: Once | INTRAMUSCULAR | Status: AC
  Administered 2021-06-10: 25 mg via INTRAVENOUS
  Filled 2021-06-10: qty 1

## 2021-06-10 MED ORDER — FAMOTIDINE 20 MG PO TABS: 20.0000 mg | ORAL_TABLET | Freq: Two times a day (BID) | ORAL | 0 refills | Status: DC

## 2021-06-10 MED ORDER — METHYLPREDNISOLONE SODIUM SUCC 125 MG IJ SOLR
125.0000 mg | Freq: Once | INTRAMUSCULAR | Status: AC
  Administered 2021-06-10: 125 mg via INTRAVENOUS
  Filled 2021-06-10: qty 2

## 2021-06-10 MED ORDER — EPINEPHRINE 0.3 MG/0.3ML IJ SOAJ
INTRAMUSCULAR | Status: AC
  Filled 2021-06-10: qty 0.3

## 2021-06-10 MED ORDER — EPINEPHRINE 0.3 MG/0.3ML IJ SOAJ: 0.3000 mg | INTRAMUSCULAR | 0 refills | Status: AC | PRN

## 2021-06-10 MED ORDER — FAMOTIDINE IN NACL 20-0.9 MG/50ML-% IV SOLN
20.0000 mg | Freq: Once | INTRAVENOUS | Status: AC
  Administered 2021-06-10: 20 mg via INTRAVENOUS
  Filled 2021-06-10: qty 50

## 2021-06-10 MED ORDER — DIPHENHYDRAMINE HCL 25 MG PO TABS: 25.0000 mg | ORAL_TABLET | Freq: Four times a day (QID) | ORAL | 0 refills | Status: DC

## 2021-06-10 NOTE — ED Provider Notes (Signed)
[facility] Provider Note   CSN: [id number] Arrival date & time: 06/10/21  1347     History Chief Complaint  Patient presents with   Allergic Reaction    Nicholas Medina is a 45 y.o. male.  The history is provided by the patient.  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  He states that he had at least 2 stings prior to arrival.  Following this, he developed an urticarial rash.  He denies any abdominal discomfort.  He denies any sensation of throat swelling.  He stated that he did initially feel short of breath but that this has improved.     Past Medical History:  Diagnosis Date   Seizures [facility])     Patient Active Problem List   Diagnosis Date Noted   ALCOHOL ABUSE 12/19/2010   COCAINE ABUSE 12/19/2010    History reviewed. No pertinent surgical history.     Family History  Family history unknown: Yes    Social History   Tobacco Use   Smoking status: Every Day    Packs/day: 0.50    Types: Cigarettes   Smokeless tobacco: Never  Vaping Use   Vaping Use: Never used  Substance Use Topics   Alcohol use: Yes   Drug use: Yes    Types: Cocaine    Comment: yesterday 04/01/19    Home Medications Prior to Admission medications   Medication Sig Start Date End Date Taking? Authorizing Provider  diphenhydrAMINE (BENADRYL) 25 MG tablet Take 1 tablet (25 mg total) by mouth every 6  (six) hours for 3 days. 06/10/21 06/13/21 Yes Godfrey Pick, MD  EPINEPHrine 0.3 mg/0.3 mL IJ SOAJ injection Inject 0.3 mg into the muscle as needed for anaphylaxis. 06/10/21  Yes Godfrey Pick, MD  famotidine (PEPCID) 20 MG tablet Take 1 tablet (20 mg total) by mouth 2 (two) times daily for 3 days. 06/10/21 06/13/21 Yes Godfrey Pick, MD  predniSONE (DELTASONE) 10 MG tablet Take 4 tablets (40 mg total) by mouth daily for 3 days. 06/10/21 06/13/21 Yes Godfrey Pick, MD    Allergies    Penicillins  Review of Systems   Review of Systems  Constitutional:  Negative for chills and fever.  HENT:  Negative for ear pain, sore throat, trouble swallowing and voice change.   Eyes:  Negative for pain and visual disturbance.  Respiratory:  Positive for shortness of breath. Negative for cough, wheezing and stridor.   Cardiovascular:  Negative for chest pain and palpitations.  Gastrointestinal:  Negative for abdominal pain, nausea and vomiting.  Genitourinary:  Negative for dysuria and hematuria.  Musculoskeletal:  Negative for arthralgias, back pain, joint swelling and neck pain.  Skin:  Positive for itching and rash. Negative for color change.  Neurological:  Negative for dizziness, seizures, syncope, weakness, light-headedness and numbness.  All other systems reviewed and are negative.  Physical Exam Updated Vital Signs BP 109/85   Pulse  76   Temp 97.9 F (36.6 C)   Resp 16   SpO2 100%   Physical Exam Vitals and nursing note reviewed.  Constitutional:      General: He is not in acute distress.    Appearance: Normal appearance. He is well-developed and normal weight. He is not ill-appearing, toxic-appearing or diaphoretic.  HENT:     Head: Normocephalic and atraumatic.     Right Ear: External ear normal.     Left Ear: External ear normal.     Nose: Nose normal.     Mouth/Throat:     Mouth: Mucous membranes are moist.     Pharynx: Oropharynx is clear.  Eyes:     Conjunctiva/sclera: Conjunctivae normal.   Cardiovascular:     Rate and Rhythm: Normal rate and regular rhythm.     Heart sounds: No murmur heard. Pulmonary:     Effort: Pulmonary effort is normal. No respiratory distress.     Breath sounds: Normal breath sounds. No wheezing or rales.  Abdominal:     Palpations: Abdomen is soft.     Tenderness: There is no abdominal tenderness. There is no guarding.  Musculoskeletal:        General: No swelling or signs of injury.     Cervical back: Neck supple.  Skin:    General: Skin is warm and dry.     Findings: Rash (Urticarial rash on torso and shoulders) present.  Neurological:     General: No focal deficit present.     Mental Status: He is alert and oriented to person, place, and time.     Cranial Nerves: No cranial nerve deficit.     Sensory: No sensory deficit.     Motor: No weakness.  Psychiatric:        Mood and Affect: Mood normal.        Behavior: Behavior normal.    ED Results / Procedures / Treatments   Labs (all labs ordered are listed, but only abnormal results are displayed) Labs Reviewed - No data to display  EKG None  Radiology No results found.  Procedures Procedures   Medications Ordered in ED Medications  EPINEPHrine (EPI-PEN) 0.3 mg/0.3 mL injection (  Given 06/10/21 1449)  diphenhydrAMINE (BENADRYL) injection 25 mg (25 mg Intravenous Given 06/10/21 1434)  famotidine (PEPCID) IVPB 20 mg premix (0 mg Intravenous Stopped 06/10/21 1516)  methylPREDNISolone sodium succinate (SOLU-MEDROL) 125 mg/2 mL injection 125 mg (125 mg Intravenous Given 06/10/21 1435)    ED Course  I have reviewed the triage vital signs and the nursing notes.  Pertinent labs & imaging results that were available during my care of the patient were reviewed by me and considered in my medical decision making (see chart for details).    MDM Rules/Calculators/A&P                           Patient is a 45 year old male who presents for an allergic reaction following bee stings.  Stings  occurred just prior to arrival.  He arrived via POV and has not taken any medications prior to his presentation in the ED.  Upon presentation, patient's vital signs are unremarkable.  He does have an urticarial rash throughout the areas of his torso and shoulders.  Abdomen is soft and nontender.  No wheezes are appreciated on lung auscultation.  Intramuscular dose of epinephrine was given.  Patient was given Benadryl, Pepcid, and Solu-Medrol.  He was kept on bedside  monitor and observed for approximately 3.5 hours.  Throughout this time, he had continued improvement in his symptoms.  Patient is given prescriptions for H1/H2 blockers and steroids over the next 3 days.  Additionally, is a good and prescription for an EpiPen.  He was encouraged to return to the ED if he has any worsening of his symptoms.  He was discharged in good condition.  Final Clinical Impression(s) / ED Diagnoses Final diagnoses:  Allergic reaction, initial encounter    Rx / DC Orders ED Discharge Orders          Ordered    diphenhydrAMINE (BENADRYL) 25 MG tablet  Every 6 hours        06/10/21 1729    famotidine (PEPCID) 20 MG tablet  2 times daily        06/10/21 1729    predniSONE (DELTASONE) 10 MG tablet  Daily        06/10/21 1729    EPINEPHrine 0.3 mg/0.3 mL IJ SOAJ injection  As needed        06/10/21 1729             Godfrey Pick, MD 06/10/21 2128

## 2021-06-10 NOTE — ED Triage Notes (Signed)
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

## 2022-01-26 IMAGING — CT CT HEAD W/O CM
3 of 4 series · 14 of 47 positions shown, 16 images · non-contrast
Comparison: Head CT dated 04/01/2020

CLINICAL DATA: Head trauma, altered mental status.

EXAM:
CT HEAD WITHOUT CONTRAST
TECHNIQUE: Contiguous axial images were obtained from the base of the skull
through the vertex without intravenous contrast.

[Series 5: coronal soft tissue · coronal · 0.34mm/px · 3 of 77 slices shown]
[im 26/77  brain]
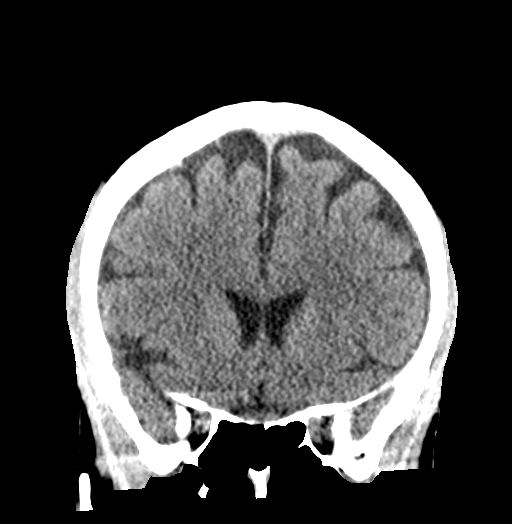
[im 34/77  brain]
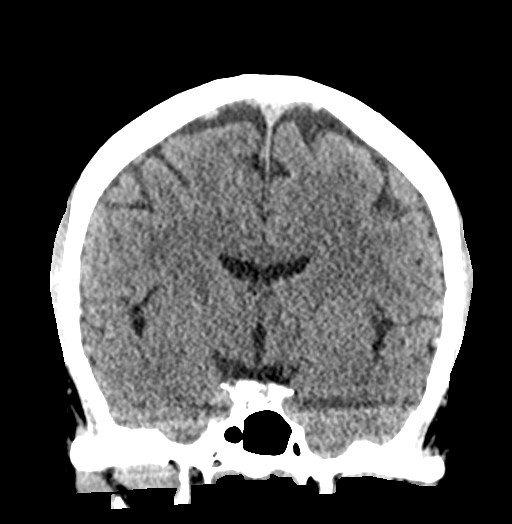
[im 43/77  brain]
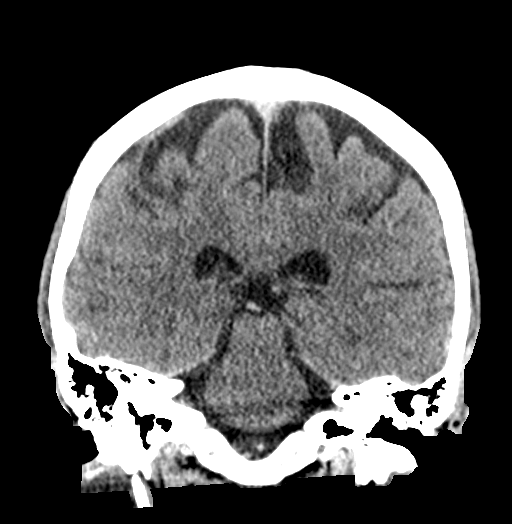

[Series 6: sagittal soft tissue · sagittal · 0.35mm/px · 3 of 59 slices shown]
[im 20/59  brain]
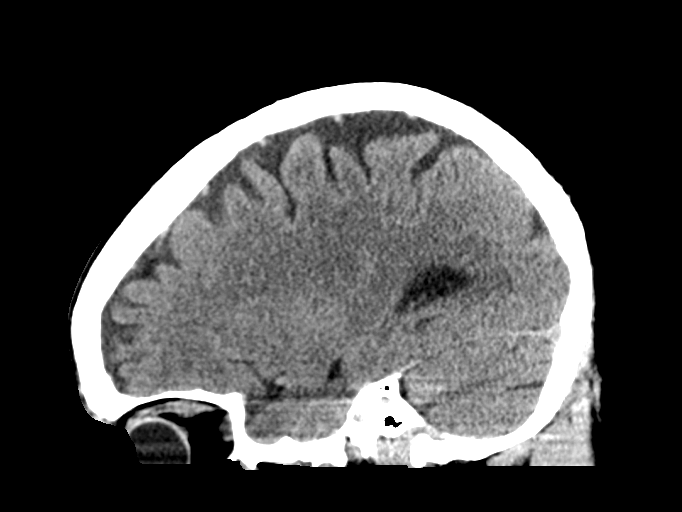
[im 30/59  brain]
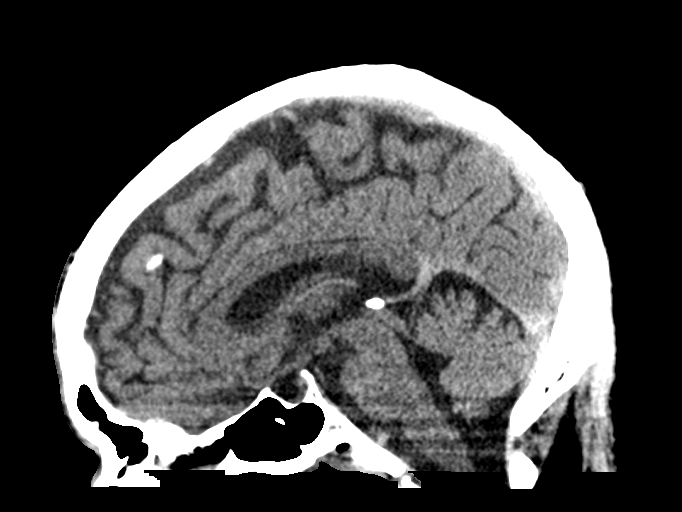
[im 39/59  brain]
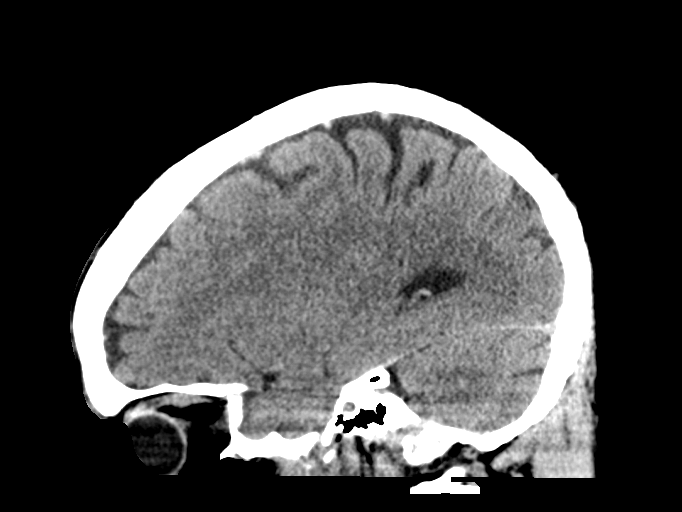

[Series 7: true axial · axial · 0.34mm/px · z∈[-100,+37]mm · 8 of 60 slices shown, 10 images]
[im 7/60  brain]
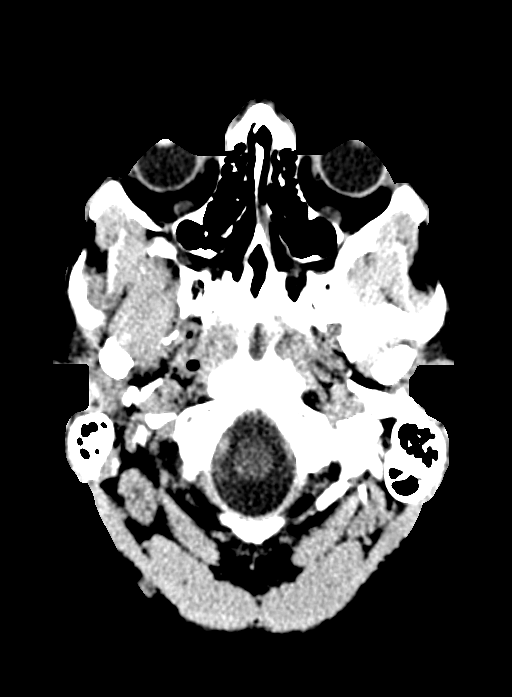
[im 7/60  bone]
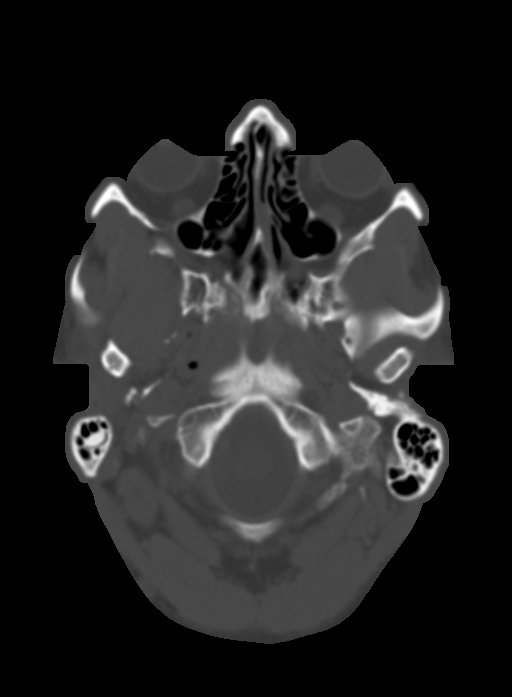
[im 14/60  brain]
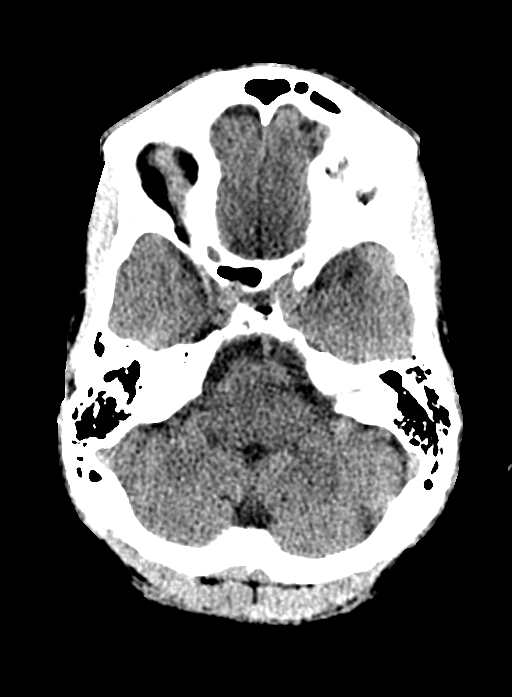
[im 20/60  brain]
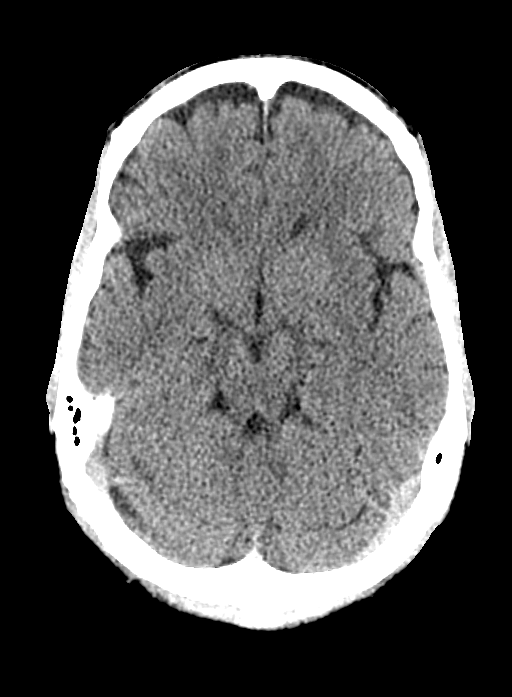
[im 27/60  brain]
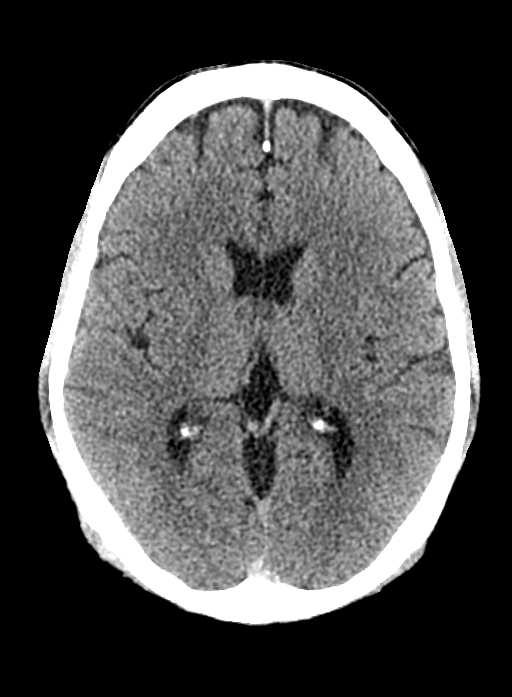
[im 33/60  brain]
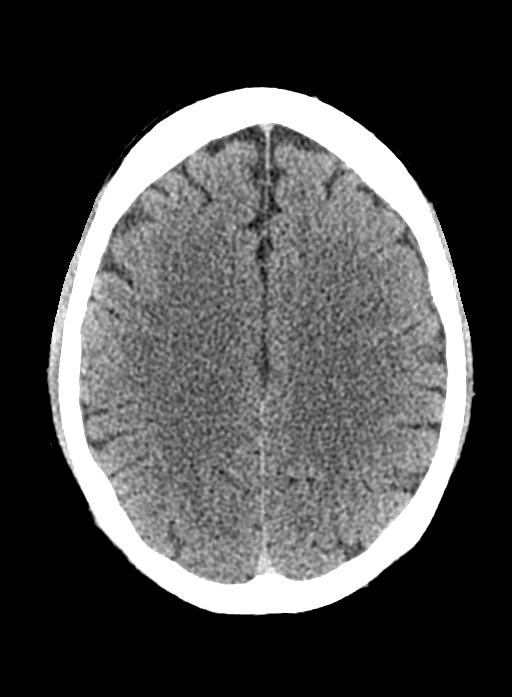
[im 33/60  bone]
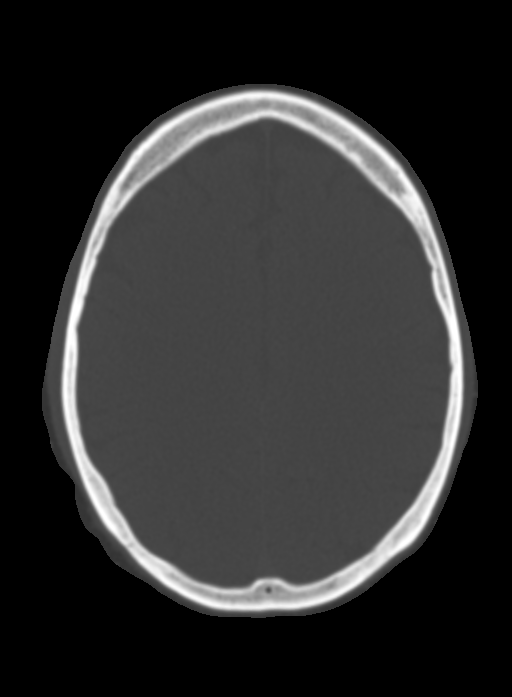
[im 40/60  brain]
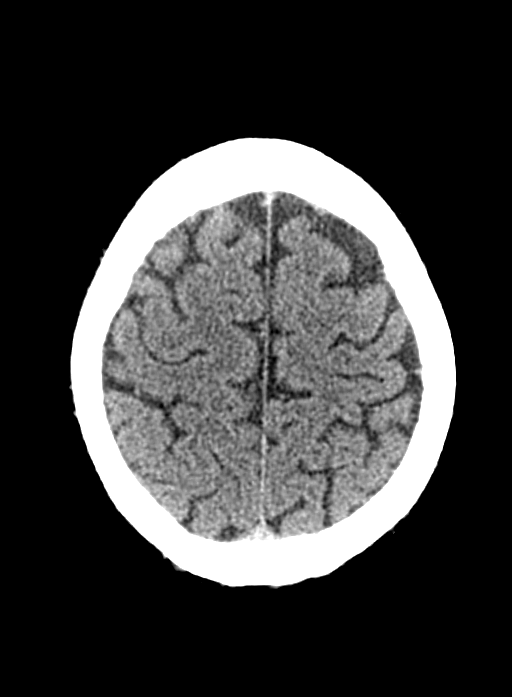
[im 46/60  brain]
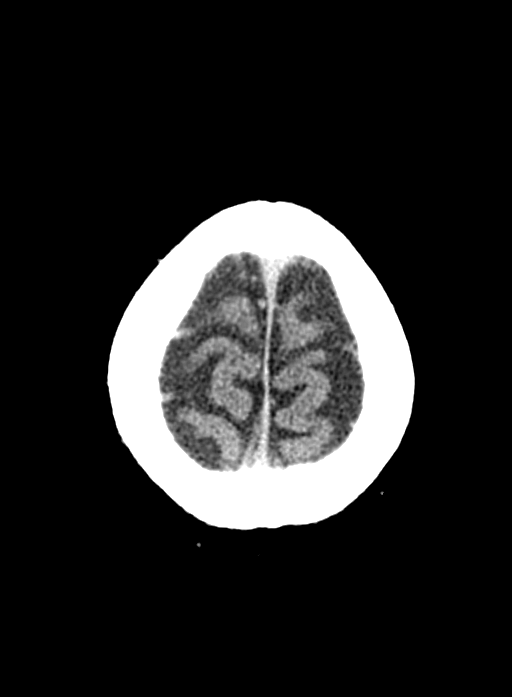
[im 53/60  brain]
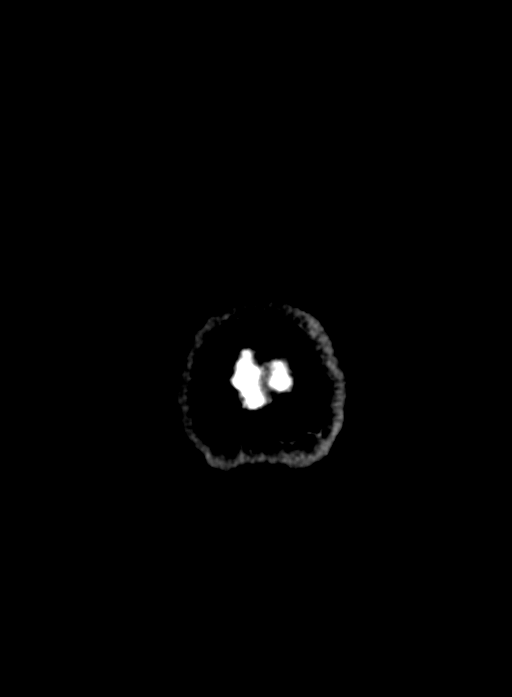

[14 of 47 positions shown; findings below may reference images not displayed]

FINDINGS: Brain: Ventricles are stable in size. There is no mass, hemorrhage,
edema or other evidence of acute parenchymal abnormality. No
extra-axial hemorrhage.

Vascular: No hyperdense vessel or unexpected calcification.

Skull: Normal. Negative for fracture or focal lesion.

Sinuses/Orbits: No acute findings.

Other: None.
IMPRESSION: Negative head CT. No intracranial mass, hemorrhage or edema. No
skull fracture.

## 2022-03-03 IMAGING — DX DG CHEST 1V PORT
1 series · 1 of 1 positions shown · non-contrast
Comparison: 11/21/2018

CLINICAL DATA: Chest pain on the right

EXAM:
PORTABLE CHEST 1 VIEW

[chest ap]
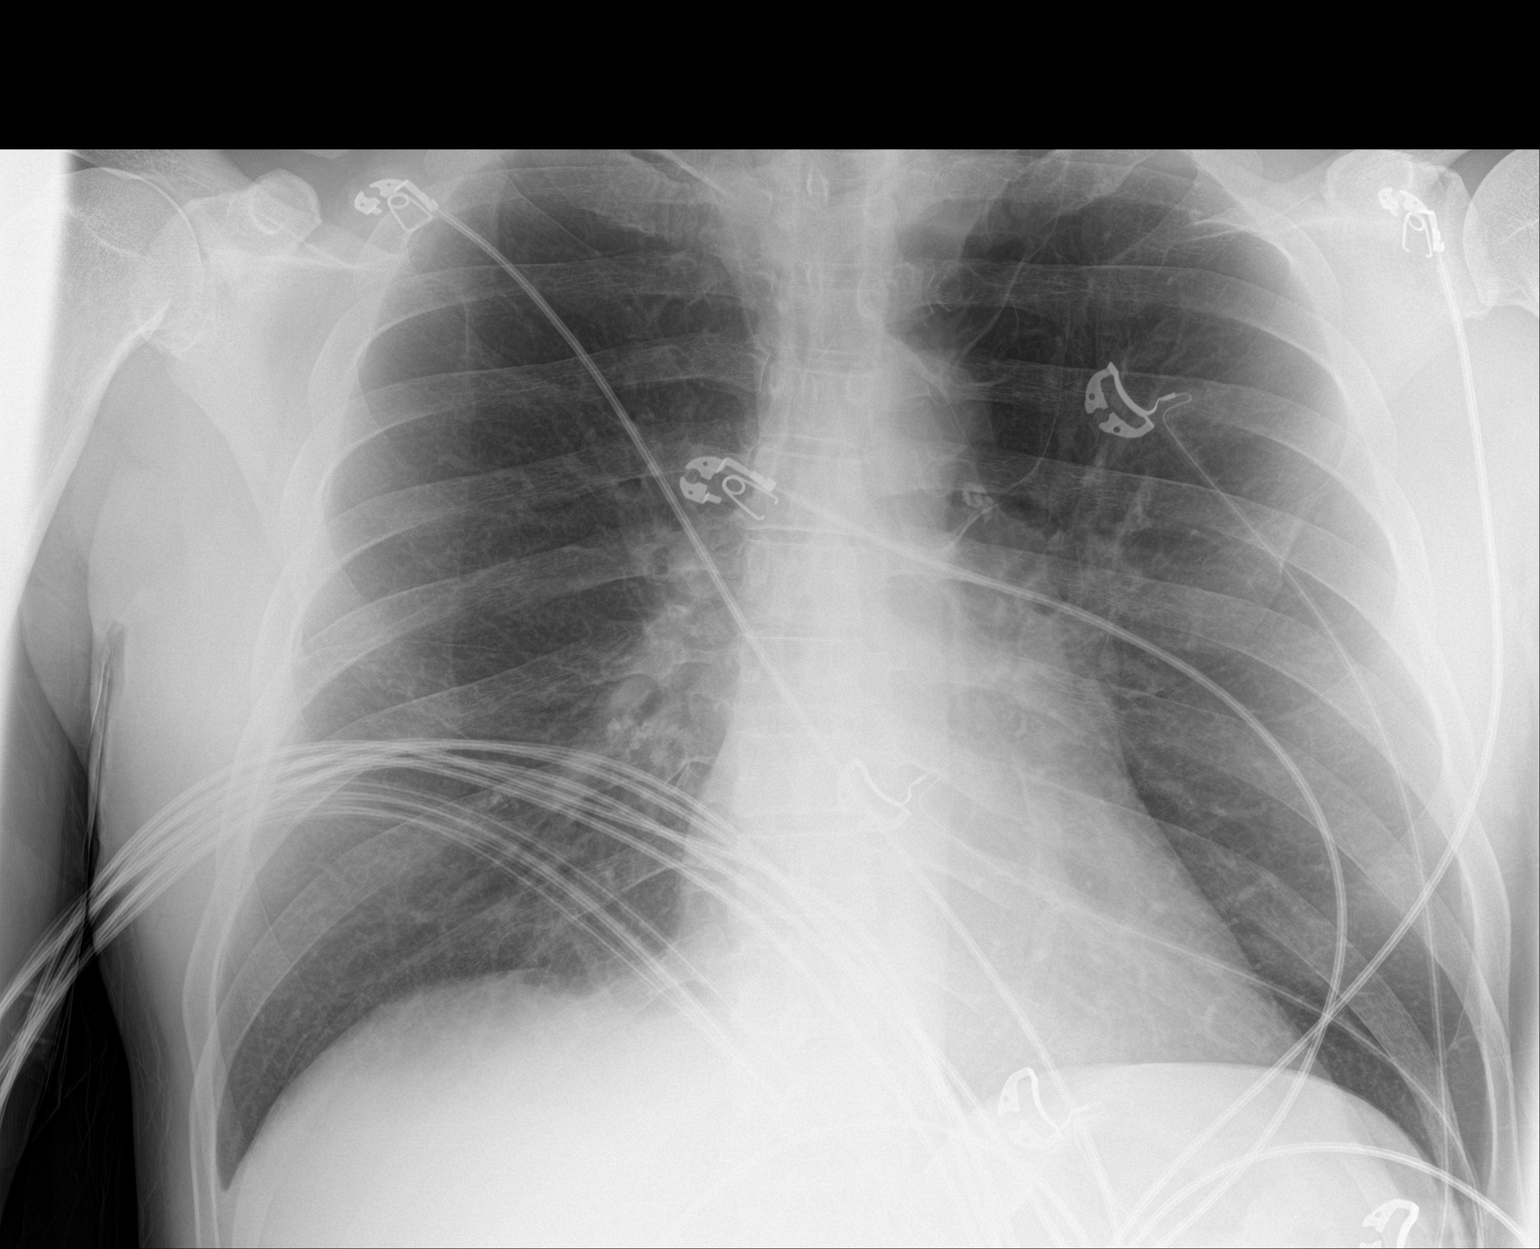

[1 of 1 positions shown; findings below may reference images not displayed]

FINDINGS: Artifact from EKG leads.

A few emphysematous spaces are seen on the left. There is no edema,
consolidation, effusion, or pneumothorax.

Normal heart size and mediastinal contours.
IMPRESSION: No evidence of active disease.

## 2022-06-08 ENCOUNTER — Telehealth: Payer: Self-pay

## 2022-06-08 ENCOUNTER — Other Ambulatory Visit: Payer: Self-pay

## 2022-06-08 ENCOUNTER — Emergency Department (HOSPITAL_COMMUNITY)
Admission: EM | Admit: 2022-06-08 | Discharge: 2022-06-08 | Disposition: A | Discharge status: Home / Self Care | Payer: Medicaid Other | Attending: Emergency Medicine | Admitting: Emergency Medicine

## 2022-06-08 ENCOUNTER — Encounter (HOSPITAL_COMMUNITY): Payer: Self-pay

## 2022-06-08 ENCOUNTER — Emergency Department (HOSPITAL_COMMUNITY): Payer: Medicaid Other

## 2022-06-08 DIAGNOSIS — R197 Diarrhea, unspecified: Secondary | ICD-10-CM | POA: Diagnosis present

## 2022-06-08 DIAGNOSIS — K50019 Crohn's disease of small intestine with unspecified complications: Secondary | ICD-10-CM

## 2022-06-08 DIAGNOSIS — K50018 Crohn's disease of small intestine with other complication: Secondary | ICD-10-CM | POA: Diagnosis not present

## 2022-06-08 LAB — CBC WITH DIFFERENTIAL/PLATELET
Abs Immature Granulocytes: 0.03 10*3/uL (ref 0.00–0.07)
Basophils Absolute: 0 10*3/uL (ref 0.0–0.1)
Basophils Relative: 0 %
Eosinophils Absolute: 0.5 10*3/uL (ref 0.0–0.5)
Eosinophils Relative: 4 %
HCT: 46 % (ref 39.0–52.0)
Hemoglobin: 16.9 g/dL (ref 13.0–17.0)
Immature Granulocytes: 0 %
Lymphocytes Relative: 23 %
Lymphs Abs: 2.7 10*3/uL (ref 0.7–4.0)
MCH: 30.3 pg (ref 26.0–34.0)
MCHC: 36.7 g/dL — ABNORMAL HIGH (ref 30.0–36.0)
MCV: 82.4 fL (ref 80.0–100.0)
Monocytes Absolute: 1.3 10*3/uL — ABNORMAL HIGH (ref 0.1–1.0)
Monocytes Relative: 11 %
Neutro Abs: 7.2 10*3/uL (ref 1.7–7.7)
Neutrophils Relative %: 62 %
Platelets: 278 10*3/uL (ref 150–400)
RBC: 5.58 MIL/uL (ref 4.22–5.81)
RDW: 12.7 % (ref 11.5–15.5)
WBC: 11.7 10*3/uL — ABNORMAL HIGH (ref 4.0–10.5)
nRBC: 0 % (ref 0.0–0.2)

## 2022-06-08 LAB — COMPREHENSIVE METABOLIC PANEL
ALT: 13 U/L (ref 0–44)
AST: 13 U/L — ABNORMAL LOW (ref 15–41)
Albumin: 3.9 g/dL (ref 3.5–5.0)
Alkaline Phosphatase: 66 U/L (ref 38–126)
Anion gap: 10 (ref 5–15)
BUN: 6 mg/dL (ref 6–20)
CO2: 25 mmol/L (ref 22–32)
Calcium: 10.4 mg/dL — ABNORMAL HIGH (ref 8.9–10.3)
Chloride: 100 mmol/L (ref 98–111)
Creatinine, Ser: 0.69 mg/dL (ref 0.61–1.24)
GFR, Estimated: >60 mL/min (ref >60)
Glucose, Bld: 107 mg/dL — ABNORMAL HIGH (ref 70–99)
Potassium: 3.7 mmol/L (ref 3.5–5.1)
Sodium: 135 mmol/L (ref 135–145)
Total Bilirubin: 0.9 mg/dL (ref 0.3–1.2)
Total Protein: 7.8 g/dL (ref 6.5–8.1)

## 2022-06-08 LAB — I-STAT CHEM 8, ED
BUN: 4 mg/dL — ABNORMAL LOW (ref 6–20)
Calcium, Ion: 1.28 mmol/L (ref 1.15–1.40)
Chloride: 99 mmol/L (ref 98–111)
Creatinine, Ser: 0.8 mg/dL (ref 0.61–1.24)
Glucose, Bld: 113 mg/dL — ABNORMAL HIGH (ref 70–99)
HCT: 48 % (ref 39.0–52.0)
Hemoglobin: 16.3 g/dL (ref 13.0–17.0)
Potassium: 3.6 mmol/L (ref 3.5–5.1)
Sodium: 135 mmol/L (ref 135–145)
TCO2: 23 mmol/L (ref 22–32)

## 2022-06-08 LAB — SEDIMENTATION RATE: Sed Rate: 6 mm/hr (ref 0–16)

## 2022-06-08 LAB — C-REACTIVE PROTEIN: CRP: 4.3 mg/dL — ABNORMAL HIGH (ref <1.0)

## 2022-06-08 LAB — LIPASE, BLOOD: Lipase: 22 U/L (ref 11–51)

## 2022-06-08 MED ORDER — DICYCLOMINE HCL 10 MG PO CAPS
10.0000 mg | ORAL_CAPSULE | Freq: Once | ORAL | Status: AC
  Administered 2022-06-08: 10 mg via ORAL
  Filled 2022-06-08: qty 1

## 2022-06-08 MED ORDER — MORPHINE SULFATE (PF) 4 MG/ML IV SOLN
4.0000 mg | Freq: Once | INTRAVENOUS | Status: AC
  Administered 2022-06-08: 4 mg via INTRAVENOUS
  Filled 2022-06-08: qty 1

## 2022-06-08 MED ORDER — ONDANSETRON HCL 4 MG/2ML IJ SOLN
4.0000 mg | Freq: Once | INTRAMUSCULAR | Status: AC
  Administered 2022-06-08: 4 mg via INTRAVENOUS
  Filled 2022-06-08: qty 2

## 2022-06-08 MED ORDER — PREDNISONE 20 MG PO TABS
60.0000 mg | ORAL_TABLET | Freq: Once | ORAL | Status: AC
  Administered 2022-06-08: 60 mg via ORAL
  Filled 2022-06-08: qty 3

## 2022-06-08 MED ORDER — SODIUM CHLORIDE 0.9 % IV BOLUS
500.0000 mL | Freq: Once | INTRAVENOUS | Status: AC
  Administered 2022-06-08: 500 mL via INTRAVENOUS

## 2022-06-08 MED ORDER — FENTANYL CITRATE PF 50 MCG/ML IJ SOSY
100.0000 ug | PREFILLED_SYRINGE | Freq: Once | INTRAMUSCULAR | Status: AC
  Administered 2022-06-08: 100 ug via INTRAVENOUS
  Filled 2022-06-08: qty 2

## 2022-06-08 MED ORDER — PREDNISONE 10 MG PO TABS: ORAL_TABLET | ORAL | 0 refills | Status: DC

## 2022-06-08 MED ORDER — SODIUM CHLORIDE (PF) 0.9 % IJ SOLN
INTRAMUSCULAR | Status: AC
  Filled 2022-06-08: qty 50

## 2022-06-08 MED ORDER — DICYCLOMINE HCL 20 MG PO TABS: 20.0000 mg | ORAL_TABLET | Freq: Three times a day (TID) | ORAL | 0 refills | Status: DC | PRN

## 2022-06-08 MED ORDER — IOHEXOL 300 MG/ML  SOLN
100.0000 mL | Freq: Once | INTRAMUSCULAR | Status: AC | PRN
  Administered 2022-06-08: 100 mL via INTRAVENOUS

## 2022-06-08 NOTE — ED Provider Triage Note (Signed)
Emergency Medicine Provider Triage Evaluation Note  Nicholas Medina , a 46 y.o. male  was evaluated in triage.  Pt complains of abdominal pain. 1 week of severe central abdominal pain and vomiting with occ diarrhea.  Taking pepto and antacids without relief.    Review of Systems  Positive: Abdominal pain, vomiting, diarrhea Negative: Fever, dysuria  Physical Exam  BP (!) 137/101 (BP Location: Left Arm)   Pulse (!) 101   Temp 98.2 F (36.8 C) (Oral)   Resp 15   SpO2 100%  Gen:   Awake, uncomfortable appearing Resp:  Normal effort  MSK:   Moves extremities without difficulty  Other:  Severe generalized abdominal tenderness  Medical Decision Making  Medically screening exam initiated at 4:29 AM.  Appropriate orders placed.  Tegan Burnside was informed that the remainder of the evaluation will be completed by another provider, this initial triage assessment does not replace that evaluation, and the importance of remaining in the ED until their evaluation is complete.     Quintella Reichert, MD 06/08/22 [phone]

## 2022-06-08 NOTE — Telephone Encounter (Signed)
Per Estill Bamberg, [place]: Patient needs first available appointment with APP/Dr. Fuller Plan, was in ER, needs follow up in 1 week ideally or soonest possible.   Patient scheduled for follow up with Amy, PA on 06/17/22 at 1:30 pm. Appointment will appear on discharge paperwork.

## 2022-06-08 NOTE — Discharge Instructions (Signed)
Testing today indicates that you probably do have Crohn's disease and that it is affecting your small intestine at this time.  We are referring you to a gastroenterologist.  Call them for a follow-up appointment as soon as possible.  Follow instructions listed and return here if needed for problems.

## 2022-06-08 NOTE — ED Provider Notes (Signed)
[facility] DEPT Provider Note   CSN: [id number] Arrival date & time: 06/08/22  0422     History  Chief Complaint  Patient presents with   Abdominal Pain    Nicholas Medina is a 46 y.o. male.  HPI Patient presents with lower abdominal cramping sensation for 1 week, with occasional diarrhea.  He has had decreased appetite but no active vomiting.  He denies fever, chills, blood in urine, weakness or dizziness.  He works a job as a Animator.    Home Medications Prior to Admission medications   Medication Sig Start Date End Date Taking? Authorizing Provider  dicyclomine (BENTYL) 20 MG tablet Take 1 tablet (20 mg total) by mouth every 8 (eight) hours as needed for spasms. 06/08/22  Yes Daleen Bo, MD  predniSONE (DELTASONE) 10 MG tablet Take q day 6,5,4,3,2,1 06/08/22  Yes Daleen Bo, MD  diphenhydrAMINE (BENADRYL) 25 MG tablet Take 1 tablet (25 mg total) by mouth every 6 (six) hours for 3 days. 06/10/21 06/13/21  Godfrey Pick, MD  EPINEPHrine 0.3 mg/0.3 mL IJ SOAJ injection Inject 0.3 mg into the muscle as needed for anaphylaxis. 06/10/21   Godfrey Pick, MD  famotidine (PEPCID) 20 MG tablet Take 1 tablet (20 mg total) by mouth 2 (two) times daily for 3 days. 06/10/21 06/13/21  Godfrey Pick, MD      Allergies    Penicillins    Review of Systems   Review of Systems  Physical Exam Updated Vital Signs BP 96/79   Pulse 65   Temp 97.9 F (36.6 C) (Oral)   Resp 16   SpO2 98%  Physical Exam Vitals and nursing note reviewed.  Constitutional:      General: He is not in acute distress.    Appearance: He is well-developed. He is not ill-appearing, toxic-appearing or diaphoretic.  HENT:     Head: Normocephalic and atraumatic.     Right Ear: External ear normal.     Left Ear: External ear normal.  Eyes:     Conjunctiva/sclera: Conjunctivae normal.     Pupils: Pupils are equal, round, and reactive to light.  Neck:     Trachea: Phonation normal.   Cardiovascular:     Rate and Rhythm: Normal rate and regular rhythm.     Heart sounds: Normal heart sounds.  Pulmonary:     Effort: Pulmonary effort is normal.     Breath sounds: Normal breath sounds.  Abdominal:     General: There is no distension.     Palpations: Abdomen is soft. There is no mass.     Tenderness: There is abdominal tenderness (Mid lower, mild).     Hernia: No hernia is present.  Musculoskeletal:        General: Normal range of motion.     Cervical back: Normal range of motion and neck supple.  Skin:    General: Skin is warm and dry.  Neurological:     Mental Status: He is alert and oriented to person, place, and time.     Cranial Nerves: No cranial nerve deficit.     Sensory: No sensory deficit.     Motor: No abnormal muscle tone.     Coordination: Coordination normal.  Psychiatric:        Behavior: Behavior normal.        Thought Content: Thought content normal.        Judgment: Judgment normal.     ED Results / Procedures / Treatments   Labs (  all labs ordered are listed, but only abnormal results are displayed) Labs Reviewed  COMPREHENSIVE METABOLIC PANEL - Abnormal; Notable for the following components:      Result Value   Glucose, Bld 107 (*)    Calcium 10.4 (*)    AST 13 (*)    All other components within normal limits  CBC WITH DIFFERENTIAL/PLATELET - Abnormal; Notable for the following components:   WBC 11.7 (*)    MCHC 36.7 (*)    Monocytes Absolute 1.3 (*)    All other components within normal limits  C-REACTIVE PROTEIN - Abnormal; Notable for the following components:   CRP 4.3 (*)    All other components within normal limits  I-STAT CHEM 8, ED - Abnormal; Notable for the following components:   BUN 4 (*)    Glucose, Bld 113 (*)    All other components within normal limits  LIPASE, BLOOD  SEDIMENTATION RATE    EKG None  Radiology CT Abdomen Pelvis W Contrast  Result Date: 06/08/2022 CLINICAL DATA:  46 year old male with  history of acute onset of nonlocalized abdominal pain. EXAM: CT ABDOMEN AND PELVIS WITH CONTRAST TECHNIQUE: Multidetector CT imaging of the abdomen and pelvis was performed using the standard protocol following bolus administration of intravenous contrast. RADIATION DOSE REDUCTION: This exam was performed according to the departmental dose-optimization program which includes automated exposure control, adjustment of the mA and/or kV according to patient size and/or use of iterative reconstruction technique. CONTRAST:  129m OMNIPAQUE IOHEXOL 300 MG/ML  SOLN COMPARISON:  CT of the abdomen and pelvis 10/08/2015. FINDINGS: Lower chest: Unremarkable. Hepatobiliary: Subcentimeter low-attenuation lesion in the periphery of the left lobe of the liver between segments 4A and 4B, too small to characterize, but statistically likely a tiny cyst (no imaging follow-up is recommended). No other suspicious cystic or solid hepatic lesions. No intra or extrahepatic biliary ductal dilatation. Gallbladder is unremarkable in appearance. Pancreas: No pancreatic mass. No pancreatic ductal dilatation. No pancreatic or peripancreatic fluid collections or inflammatory changes. Spleen: Unremarkable. Adrenals/Urinary Tract: Bilateral kidneys and bilateral adrenal glands are normal in appearance. No hydroureteronephrosis. Urinary bladder is normal in appearance. Stomach/Bowel: The appearance of the stomach is normal. Multiple borderline and borderline dilated loops of small bowel are noted in the left side of the abdomen measuring up to 2.9 cm, with several air-fluid levels. There is profound mural thickening throughout the distal and terminal ileum where there is also mucosal hyperenhancement and surrounding inflammatory changes, concerning for severe enteritis. A few scattered colonic diverticuli are noted, without definite focal surrounding inflammatory changes to clearly indicate an acute diverticulitis at this time. Normal appendix.  Vascular/Lymphatic: No significant atherosclerotic disease, aneurysm or dissection noted in the abdominal or pelvic vasculature. No lymphadenopathy noted in the abdomen or pelvis. Reproductive: Prostate gland and seminal vesicles are unremarkable in appearance. Other: Small volume of ascites.  No pneumoperitoneum. Musculoskeletal: There are no aggressive appearing lytic or blastic lesions noted in the visualized portions of the skeleton. IMPRESSION: 1. Findings are concerning for severe ileitis, either from infectious ileitis or inflammatory bowel disease such as Crohn's disease. Further clinical evaluation is recommended. 2. Small volume of ascites is presumably reactive. 3. Colonic diverticulosis. Electronically Signed   By: DVinnie LangtonM.D.   On: 06/08/2022 06:18    Procedures Procedures    Medications Ordered in ED Medications  morphine (PF) 4 MG/ML injection 4 mg (4 mg Intravenous Given 06/08/22 0451)  ondansetron (ZOFRAN) injection 4 mg (4 mg Intravenous Given 06/08/22 0451)  sodium chloride (PF) 0.9 % injection (  Given by Other 06/08/22 0551)  iohexol (OMNIPAQUE) 300 MG/ML solution 100 mL (100 mLs Intravenous Contrast Given 06/08/22 0535)  sodium chloride 0.9 % bolus 500 mL (0 mLs Intravenous Stopped 06/08/22 0917)  fentaNYL (SUBLIMAZE) injection 100 mcg (100 mcg Intravenous Given 06/08/22 0809)  ondansetron (ZOFRAN) injection 4 mg (4 mg Intravenous Given 06/08/22 0808)  predniSONE (DELTASONE) tablet 60 mg (60 mg Oral Given 06/08/22 0917)  dicyclomine (BENTYL) capsule 10 mg (10 mg Oral Given 06/08/22 2423)    ED Course/ Medical Decision Making/ A&P                           Medical Decision Making Patient presenting with recurrent abdominal discomfort, previously diagnosed and treated for Crohn's disease, 18 years ago.  Lost to follow-up and did not continue care from gastroenterology at that time.  He has done well in the interim.  No history of abdominal surgery.  He is currently  employed.  Problems Addressed: Crohn's disease of ileum with complication [facility]): acute illness or injury with systemic symptoms that poses a threat to life or bodily functions  Amount and/or Complexity of Data Reviewed Independent Historian:     Details: He is a cogent historian External Data Reviewed: notes.    Details: Prior records from 2005 when he was treated with Pentasa for Crohn's disease. Labs: ordered.    Details: C-reactive protein elevated, chemistry panel normal except glucose high, CBC normal except white count Radiology: ordered and independent interpretation performed.    Details: CT abdomen pelvis-consistent with Crohn's flare of the ileum Discussion of management or test interpretation with external provider(s): Case discussed with gastroenterology PA on-call, will evaluate in the office.  Request treat with dicyclomine and prednisone.  Risk Prescription drug management. Decision regarding hospitalization. Risk Details: Recurrent Crohn's flare, with ileitis.  No significant metabolic abnormalities.  No hemodynamic instability.  No evidence for dehydration at this time.  Mild pain controlled with medications given in the ED.  He does not require hospitalization.           Final Clinical Impression(s) / ED Diagnoses Final diagnoses:  Crohn's disease of ileum with complication ([place])    Rx / DC Orders ED Discharge Orders          Ordered    dicyclomine (BENTYL) 20 MG tablet  Every 8 hours PRN        06/08/22 1208    predniSONE (DELTASONE) 10 MG tablet        06/08/22 1208              Daleen Bo, MD 06/08/22 1212

## 2022-06-08 NOTE — ED Triage Notes (Signed)
Patient has had lower abdominal pain in the middle for a week now. Threw up x2 Friday. Has not been able to eat or drink.

## 2022-06-17 ENCOUNTER — Encounter: Payer: Self-pay | Admitting: Physician Assistant

## 2022-06-17 ENCOUNTER — Other Ambulatory Visit: Payer: Medicaid Other

## 2022-06-17 ENCOUNTER — Ambulatory Visit (INDEPENDENT_AMBULATORY_CARE_PROVIDER_SITE_OTHER): Payer: Medicaid Other | Admitting: Physician Assistant

## 2022-06-17 VITALS — BP 122/78 | HR 92 | Ht 70.0 in | Wt 178.6 lb

## 2022-06-17 DIAGNOSIS — R197 Diarrhea, unspecified: Secondary | ICD-10-CM

## 2022-06-17 DIAGNOSIS — R9389 Abnormal findings on diagnostic imaging of other specified body structures: Secondary | ICD-10-CM

## 2022-06-17 DIAGNOSIS — R1084 Generalized abdominal pain: Secondary | ICD-10-CM | POA: Diagnosis not present

## 2022-06-17 DIAGNOSIS — K529 Noninfective gastroenteritis and colitis, unspecified: Secondary | ICD-10-CM

## 2022-06-17 DIAGNOSIS — R109 Unspecified abdominal pain: Secondary | ICD-10-CM

## 2022-06-17 MED ORDER — NA SULFATE-K SULFATE-MG SULF 17.5-3.13-1.6 GM/177ML PO SOLN: 1.0000 | Freq: Once | ORAL | 0 refills | Status: AC

## 2022-06-17 MED ORDER — DICYCLOMINE HCL 10 MG PO CAPS: 10.0000 mg | ORAL_CAPSULE | Freq: Four times a day (QID) | ORAL | 1 refills | Status: DC | PRN

## 2022-06-17 MED ORDER — PREDNISONE 10 MG PO TABS: 20.0000 mg | ORAL_TABLET | Freq: Every day | ORAL | 0 refills | Status: AC

## 2022-06-17 NOTE — Patient Instructions (Signed)
If you are age 45 or younger, your body mass index should be between 19-25. Your Body mass index is 25.63 kg/m. If this is out of the aformentioned range listed, please consider follow up with your Primary Care Provider.  ________________________________________________________  The [facility] providers would like to encourage you to use [facility] to communicate with providers for non-urgent requests or questions.  Due to long hold times on the telephone, sending your provider a message by [facility] may be a faster and more efficient way to get a response.  Please allow 48 business hours for a response.  Please remember that this is for non-urgent requests.  _______________________________________________________  Dennis Bast have been scheduled for a colonoscopy. Please follow written instructions given to you at your visit today.  Please pick up your prep supplies at the pharmacy within the next 1-3 days. If you use inhalers (even only as needed), please bring them with you on the day of your procedure.  Your provider has requested that you go to the basement level for lab work before leaving today. Press "B" on the elevator. The lab is located at the first door on the left as you exit the elevator.  START Prednisone 10 mg 2 tablets every morning Continue Dicyclomine 10 mg every 6 hours as needed  Use Tylenol extra strength as directed for pain  Follow up pending the results of you Colonoscopy  Thank you for entrusting me with your care and choosing [facility].  Amy Esterwood, PA-C

## 2022-06-17 NOTE — Progress Notes (Signed)
Subjective:    Patient ID: Nicholas Medina, male    DOB: 13-Jan-1976, 46 y.o.   MRN: [id number]  HPI  [name] is a pleasant 46 year old African-American male, referred after recent ER visit on 06/08/2022.  He is known remotely to Dr. Fuller Plan and had been seen once in 2005. Patient relates chronic issues with diarrhea/loose stools over the past several years usually having at least 4-5 bowel movements per day.  He has not had melena or hematochezia.  He relates having possible diagnosis of Crohn's disease in the past but has never had colonoscopy.  He had been on Pentasa for a very short period of time around 2005, according to the notes but does not recall whether this was helpful or not.  CT scan at that time in 2005 had shown thickening of the terminal ileum and a small bowel follow-through had shown some minimal thickening of the terminal ileum.  Patient says he has not been on any medication to his knowledge since that time.  When he presented to the emergency room on 06/08/2022 he says he had had abrupt onset of fairly severe stabbing cramping type mid abdominal pain about 5 to 6 days prior to that ER visit.  Symptoms had been persistent and somewhat progressive prompting him to go to the emergency room.  He did not have any associated fever or chills, no nausea or vomiting but was feeling bloated gassy and had an increase in the number of loose to diarrheal stools over his usual. Labs had shown a WBC of 11.7/hemoglobin 16.9/hematocrit 46 Sed rate 6/CRP 4.3 LFTs within normal limits. CT of the abdomen pelvis with contrast showed a probable tiny cyst in the periphery of the left lobe of the liver, multiple borderline and borderline dilated loops of small bowel in the left abdomen measuring up to 2.9 cm, there was profound mural thickening throughout the distal and terminal ileum where there was also mucosal hyperenhancement and surrounding inflammatory changes concerning for severe enteritis, there are few  scattered colonic diverticuli no focal surrounding inflammatory changes, normal-appearing appendix, small amount of ascites.  He was given a steroid Dosepak and a prescription for dicyclomine.  He says that he did complete the steroid Dosepak and thought it may have helped a little bit while he was on it.  He did not find the dicyclomine to be that beneficial. Currently does not feel severe cramping but has persistent discomfort and mild or cramping and continues to have 4-7 loose bowel movements per day  Reviewing previous records there is mention of EtOH and cocaine abuse/use.  He says he still drinks alcohol but does not drink on a daily basis, perhaps once or twice per week, still occasionally uses cocaine.  He does not think that he had used any cocaine just prior to onset of this most recent episode.  Review of Systems Pertinent positive and negative review of systems were noted in the above HPI section.  All other review of systems was otherwise negative.   Outpatient Encounter Medications as of 06/17/2022  Medication Sig   dicyclomine (BENTYL) 10 MG capsule Take 1 capsule (10 mg total) by mouth every 6 (six) hours as needed for spasms.   Na Sulfate-K Sulfate-Mg Sulf 17.5-3.13-1.6 GM/177ML SOLN Take 1 kit by mouth once for 1 dose.   predniSONE (DELTASONE) 10 MG tablet Take 2 tablets (20 mg total) by mouth daily with breakfast for 14 days.   [DISCONTINUED] dicyclomine (BENTYL) 20 MG tablet Take 1 tablet (20 mg  total) by mouth every 8 (eight) hours as needed for spasms.   EPINEPHrine 0.3 mg/0.3 mL IJ SOAJ injection Inject 0.3 mg into the muscle as needed for anaphylaxis. (Patient not taking: Reported on 06/17/2022)   [DISCONTINUED] diphenhydrAMINE (BENADRYL) 25 MG tablet Take 1 tablet (25 mg total) by mouth every 6 (six) hours for 3 days.   [DISCONTINUED] famotidine (PEPCID) 20 MG tablet Take 1 tablet (20 mg total) by mouth 2 (two) times daily for 3 days.   [DISCONTINUED] predniSONE (DELTASONE) 10  MG tablet Take q day 6,5,4,3,2,1   No facility-administered encounter medications on file as of 06/17/2022.   Allergies  Allergen Reactions   Penicillins Nausea And Vomiting    Has patient had a PCN reaction causing immediate rash, facial/tongue/throat swelling, SOB or lightheadedness with hypotension: No Has patient had a PCN reaction causing severe rash involving mucus membranes or skin necrosis: No Has patient had a PCN reaction that required hospitalization No Has patient had a PCN reaction occurring within the last 10 years: Yes - 2016  If all of the above answers are "NO", then may proceed with Cephalosporin use.    Patient Active Problem List   Diagnosis Date Noted   ALCOHOL ABUSE 12/19/2010   COCAINE ABUSE 12/19/2010   Social History   Socioeconomic History   Marital status: Legally Separated    Spouse name: Not on file   Number of children: Not on file   Years of education: Not on file   Highest education level: Not on file  Occupational History   Not on file  Tobacco Use   Smoking status: Every Day    Packs/day: 0.50    Types: Cigarettes   Smokeless tobacco: Never  Vaping Use   Vaping Use: Never used  Substance and Sexual Activity   Alcohol use: Yes   Drug use: Yes    Types: Marijuana    Comment: yesterday 04/01/19   Sexual activity: Yes  Other Topics Concern   Not on file  Social History Narrative   Not on file   Social Determinants of Health   Financial Resource Strain: Not on file  Food Insecurity: Not on file  Transportation Needs: Not on file  Physical Activity: Not on file  Stress: Not on file  Social Connections: Not on file  Intimate Partner Violence: Not on file    Mr. Vokes Family history is unknown by patient.      Objective:    Vitals:   06/17/22 1321  BP: 122/78  Pulse: 92    Physical Exam Well-developed well-nourished AA male  in no acute distress.  Height, Weight,178  BMI 25.6  HEENT; nontraumatic normocephalic, EOMI, PE R  LA, sclera anicteric. Oropharynx;not done Neck; supple, no JVD Cardiovascular; regular rate and rhythm with S1-S2, no murmur rub or gallop Pulmonary; Clear bilaterally Abdomen; soft, there is tenderness on the mid lower abd and RLQ , no guarding,non distended, no palpable mass or hepatosplenomegaly, bowel sounds are active Rectal;not done today  Skin; benign exam, no jaundice rash or appreciable lesions Extremities; no clubbing cyanosis or edema skin warm and dry Neuro/Psych; alert and oriented x4, grossly nonfocal mood and affect appropriate        Assessment & Plan:   #36 46 year old African-American male with history of chronic loose stools with anywhere from 4-6 bowel movements daily over the past several years.  Very remote imaging in 2005 had suggested ileitis, and had a tentative diagnosis of probable Crohn's disease and had been on  Pentasa at 1 point in 2005.  He has not had any medication for GI symptoms since then. Patient had ER visit on 06/08/2022 after abrupt onset of severe crampy abdominal pain 5 to 6 days previous, associated with bloating and gassiness and increase in diarrheal stools, but no melena or hematochezia, no fever, no vomiting.  Imaging showed multiple loops of borderline dilated small bowel in the left abdomen up to 2.9 cm and profound mural thickening throughout the distal and terminal ileum with some surrounding inflammatory changes normal-appearing appendix, and colon read as normal other than left colon diverticulosis. Changes felt to be consistent with infectious or inflammatory enteritis.  Patient had been given a steroid Dosepak which he completed, some mild improvement while he was on this. Now with ongoing abdominal soreness and discomfort, not severe pain and continues to have 4-7 loose bowel movements per day.  I suspect he may have underlying Crohn's disease/IBD involving the distal ileum and terminal ileum. Cocaine can cause an ischemic enteritis or  colitis though this would be an unusual area for this to present.  Plan; check fecal calprotectin Patient will be scheduled for colonoscopy with Dr. Hilarie Fredrickson (Dr. Fuller Plan did not have availability for 6 to 7 weeks).  Procedure was discussed in detail with the patient including indications risk and benefits and he is agreeable to proceed. Start prednisone 20 mg p.o. every morning which he is to continue until further instructions at the time of colonoscopy. Will refill dicyclomine 10 mg p.o. 3 times daily as needed for abdominal pain/cramping Advised to use regular strength Tylenol as needed for additional pain control. Shemiah Rosch S Eluzer Howdeshell PA-C 06/17/2022   Cc: No ref. provider found

## 2022-06-18 ENCOUNTER — Encounter: Payer: Self-pay | Admitting: Certified Registered Nurse Anesthetist

## 2022-06-18 NOTE — Progress Notes (Signed)
Addendum: Reviewed and agree with assessment and management plan. Raciel Caffrey M, MD  

## 2022-06-25 ENCOUNTER — Ambulatory Visit (AMBULATORY_SURGERY_CENTER): Payer: Medicaid Other | Admitting: Internal Medicine

## 2022-06-25 ENCOUNTER — Encounter: Payer: Self-pay | Admitting: Internal Medicine

## 2022-06-25 VITALS — BP 102/68 | HR 69 | Temp 97.7°F | Resp 14 | Ht 70.0 in | Wt 178.0 lb

## 2022-06-25 DIAGNOSIS — R9389 Abnormal findings on diagnostic imaging of other specified body structures: Secondary | ICD-10-CM

## 2022-06-25 DIAGNOSIS — K6289 Other specified diseases of anus and rectum: Secondary | ICD-10-CM | POA: Diagnosis not present

## 2022-06-25 DIAGNOSIS — K529 Noninfective gastroenteritis and colitis, unspecified: Secondary | ICD-10-CM | POA: Diagnosis not present

## 2022-06-25 DIAGNOSIS — K635 Polyp of colon: Secondary | ICD-10-CM

## 2022-06-25 DIAGNOSIS — D125 Benign neoplasm of sigmoid colon: Secondary | ICD-10-CM

## 2022-06-25 DIAGNOSIS — K620 Anal polyp: Secondary | ICD-10-CM

## 2022-06-25 DIAGNOSIS — K639 Disease of intestine, unspecified: Secondary | ICD-10-CM | POA: Diagnosis not present

## 2022-06-25 MED ORDER — SODIUM CHLORIDE 0.9 % IV SOLN: 500.0000 mL | Freq: Once | INTRAVENOUS | Status: DC

## 2022-06-25 NOTE — Progress Notes (Signed)
Assisted pt up to bathroom  

## 2022-06-25 NOTE — Progress Notes (Signed)
Pt's states no medical or surgical changes since previsit or office visit. 

## 2022-06-25 NOTE — Op Note (Signed)
[facility] Patient Name: Nicholas Medina Procedure Date: 06/25/2022 7:25 AM MRN: [id number] Endoscopist: Jerene Bears , MD Age: 46 Referring MD:  Date of Birth: [birth year] Gender: Male Account #: [id number] Procedure:                Colonoscopy Indications:              Abnormal CT of the GI tract showing ileitis Medicines:                Monitored Anesthesia Care Procedure:                Pre-Anesthesia Assessment:                           - Prior to the procedure, a History and Physical                            was performed, and patient medications and                            allergies were reviewed. The patient's tolerance of                            previous anesthesia was also reviewed. The risks                            and benefits of the procedure and the sedation                            options and risks were discussed with the patient.                            All questions were answered, and informed consent                            was obtained. Prior Anticoagulants: The patient has                            taken no previous anticoagulant or antiplatelet                            agents. ASA Grade Assessment: III - A patient with                            severe systemic disease. After reviewing the risks                            and benefits, the patient was deemed in                            satisfactory condition to undergo the procedure.                           After obtaining informed consent, the colonoscope  was passed under direct vision. Throughout the                            procedure, the patient's blood pressure, pulse, and                            oxygen saturations were monitored continuously. The                            Olympus PCF-H190DL (#[id number]) Colonoscope was                            introduced through the anus and advanced to the                            terminal ileum. The  colonoscopy was performed                            without difficulty. The patient tolerated the                            procedure well. The quality of the bowel                            preparation was good. The terminal ileum, ileocecal                            valve, appendiceal orifice, and rectum were                            photographed. Scope In: 8:10:15 AM Scope Out: 8:26:05 AM Scope Withdrawal Time: 0 hours 13 minutes 52 seconds  Total Procedure Duration: 0 hours 15 minutes 50 seconds  Findings:                 The digital rectal exam was normal.                           A diffuse area of mucosa in the distal ileum was                            moderately erythematous. No ulcerations or erosions                            seen. This was biopsied with a cold forceps for                            histology.                           A 5 mm polyp was found in the distal rectum. The                            polyp was sessile. The polyp was removed with a  cold snare. Resection and retrieval were complete.                           A 4 mm polyp was found in the anus. The polyp was                            flat. Biopsies were taken with a cold forceps for                            histology.                           Multiple small and large-mouthed diverticula were                            found in the sigmoid colon, descending colon,                            hepatic flexure and ascending colon.                           No additional abnormalities were found on                            retroflexion. Complications:            No immediate complications. Estimated Blood Loss:     Estimated blood loss was minimal. Impression:               - Erythematous mucosa in the distal ileum without                            erosion or ulceration. Biopsied.                           - One 5 mm polyp in the distal rectum, removed with                             a cold snare. Resected and retrieved.                           - One 4 mm polyp at the anus. Biopsied.                           - Moderate diverticulosis in the sigmoid colon, in                            the descending colon, at the hepatic flexure and in                            the ascending colon.                           - No evidence of colitis. Recommendation:           - Patient has a  contact number available for                            emergencies. The signs and symptoms of potential                            delayed complications were discussed with the                            patient. Return to normal activities tomorrow.                            Written discharge instructions were provided to the                            patient.                           - Resume previous diet.                           - Continue present medications.                           - Await pathology results.                           - Repeat colonoscopy is recommended. The                            colonoscopy date will be determined after pathology                            results from today's exam become available for                            review. Jerene Bears, MD 06/25/2022 8:39:00 AM This report has been signed electronically.

## 2022-06-25 NOTE — Progress Notes (Signed)
See office note dated 06/17/2022 for details and current H&P.  Patient presenting for colonoscopy to evaluate abnormal imaging and concern for ileitis.  He remains appropriate for colonoscopy in the [facility] today.

## 2022-06-25 NOTE — Progress Notes (Signed)
Called to room to assist during endoscopic procedure.  Patient ID and intended procedure confirmed with present staff. Received instructions for my participation in the procedure from the performing physician.  

## 2022-06-25 NOTE — Patient Instructions (Signed)
Handout given on polyps and diverticulosis  YOU HAD AN ENDOSCOPIC PROCEDURE TODAY AT [facility]:   Refer to the procedure report that was given to you for any specific questions about what was found during the examination.  If the procedure report does not answer your questions, please call your gastroenterologist to clarify.  If you requested that your care partner not be given the details of your procedure findings, then the procedure report has been included in a sealed envelope for you to review at your convenience later.  YOU SHOULD EXPECT: Some feelings of bloating in the abdomen. Passage of more gas than usual.  Walking can help get rid of the air that was put into your GI tract during the procedure and reduce the bloating. If you had a lower endoscopy (such as a colonoscopy or flexible sigmoidoscopy) you may notice spotting of blood in your stool or on the toilet paper. If you underwent a bowel prep for your procedure, you may not have a normal bowel movement for a few days.  Please Note:  You might notice some irritation and congestion in your nose or some drainage.  This is from the oxygen used during your procedure.  There is no need for concern and it should clear up in a day or so.  SYMPTOMS TO REPORT IMMEDIATELY:  Following lower endoscopy (colonoscopy or flexible sigmoidoscopy):  Excessive amounts of blood in the stool  Significant tenderness or worsening of abdominal pains  Swelling of the abdomen that is new, acute  Fever of 100F or higher   For urgent or emergent issues, a gastroenterologist can be reached at any hour by calling [phone]. Do not use MyChart messaging for urgent concerns.    DIET:  We do recommend a small meal at first, but then you may proceed to your regular diet.  Drink plenty of fluids but you should avoid alcoholic beverages for 24 hours.  ACTIVITY:  You should plan to take it easy for the rest of today and you should NOT DRIVE or  use heavy machinery until tomorrow (because of the sedation medicines used during the test).    FOLLOW UP: Our staff will call the number listed on your records the next business day following your procedure.  We will call around 7:15- 8:00 am to check on you and address any questions or concerns that you may have regarding the information given to you following your procedure. If we do not reach you, we will leave a message.  If you develop any symptoms (ie: fever, flu-like symptoms, shortness of breath, cough etc.) before then, please call [phone].  If you test positive for Covid 19 in the 2 weeks post procedure, please call and report this information to [place].    If any biopsies were taken you will be contacted by phone or by letter within the next 1-3 weeks.  Please call us at [phone] if you have not heard about the biopsies in 3 weeks.    SIGNATURES/CONFIDENTIALITY: You and/or your care partner have signed paperwork which will be entered into your electronic medical record.  These signatures attest to the fact that that the information above on your After Visit Summary has been reviewed and is understood.  Full responsibility of the confidentiality of this discharge information lies with you and/or your care-partner.

## 2022-06-25 NOTE — Progress Notes (Signed)
Report given to PACU, vss 

## 2022-06-26 ENCOUNTER — Telehealth: Payer: Self-pay | Admitting: *Deleted

## 2022-06-26 NOTE — Telephone Encounter (Signed)
Attempt for follow up phone call. No answer at number given.  Unable to leave message on voicemail.

## 2022-06-29 ENCOUNTER — Encounter: Payer: Self-pay | Admitting: Internal Medicine

## 2022-09-14 ENCOUNTER — Emergency Department (HOSPITAL_COMMUNITY): Payer: Medicaid Other

## 2022-09-14 ENCOUNTER — Other Ambulatory Visit: Payer: Self-pay

## 2022-09-14 ENCOUNTER — Encounter (HOSPITAL_COMMUNITY): Payer: Self-pay

## 2022-09-14 ENCOUNTER — Emergency Department (HOSPITAL_COMMUNITY)
Admission: EM | Admit: 2022-09-14 | Discharge: 2022-09-14 | Discharge status: Against Medical Advice | Payer: Medicaid Other | Attending: Student | Admitting: Student

## 2022-09-14 DIAGNOSIS — Z5321 Procedure and treatment not carried out due to patient leaving prior to being seen by health care provider: Secondary | ICD-10-CM | POA: Diagnosis not present

## 2022-09-14 DIAGNOSIS — R002 Palpitations: Secondary | ICD-10-CM | POA: Diagnosis present

## 2022-09-14 DIAGNOSIS — R110 Nausea: Secondary | ICD-10-CM | POA: Diagnosis not present

## 2022-09-14 DIAGNOSIS — R0789 Other chest pain: Secondary | ICD-10-CM | POA: Diagnosis not present

## 2022-09-14 LAB — BASIC METABOLIC PANEL
Anion gap: 9 (ref 5–15)
BUN: 12 mg/dL (ref 6–20)
CO2: 25 mmol/L (ref 22–32)
Calcium: 8.7 mg/dL — ABNORMAL LOW (ref 8.9–10.3)
Chloride: 104 mmol/L (ref 98–111)
Creatinine, Ser: 0.93 mg/dL (ref 0.61–1.24)
GFR, Estimated: >60 mL/min (ref >60)
Glucose, Bld: 80 mg/dL (ref 70–99)
Potassium: 3.7 mmol/L (ref 3.5–5.1)
Sodium: 138 mmol/L (ref 135–145)

## 2022-09-14 LAB — CBC
HCT: 40 % (ref 39.0–52.0)
Hemoglobin: 14.3 g/dL (ref 13.0–17.0)
MCH: 29.7 pg (ref 26.0–34.0)
MCHC: 35.8 g/dL (ref 30.0–36.0)
MCV: 83.2 fL (ref 80.0–100.0)
Platelets: 223 10*3/uL (ref 150–400)
RBC: 4.81 MIL/uL (ref 4.22–5.81)
RDW: 13.2 % (ref 11.5–15.5)
WBC: 7.7 10*3/uL (ref 4.0–10.5)
nRBC: 0 % (ref 0.0–0.2)

## 2022-09-14 LAB — TROPONIN I (HIGH SENSITIVITY): Troponin I (High Sensitivity): 3 ng/L (ref <18)

## 2022-09-14 LAB — MAGNESIUM: Magnesium: 1.7 mg/dL (ref 1.7–2.4)

## 2022-09-14 LAB — TSH: TSH: 0.15 u[IU]/mL — ABNORMAL LOW (ref 0.350–4.500)

## 2022-09-14 NOTE — ED Triage Notes (Signed)
Pt reports palpitations/pressure to right chest and down right arm x2 hrs.  Denies shob.  Pt reports hx of anxiety attacks.

## 2022-09-14 NOTE — ED Provider Triage Note (Signed)
Emergency Medicine Provider Triage Evaluation Note  Nicholas Medina , a 46 y.o. male  was evaluated in triage.  Pt complains of palpitations and chest discomfort x2 hours.  Is been constant, not worse with exertion or breathing.  No history of the same.  Does not radiate elsewhere.  Associate with nausea but no vomiting.  No history of previous ACS or PE.Marland Kitchen  Review of Systems  Per HPI  Physical Exam  BP 133/88   Pulse 84   Temp 98.7 F (37.1 C) (Oral)   Resp 12   Ht 6' (1.829 m)   Wt 79.4 kg   SpO2 99%   BMI 23.73 kg/m  Gen:   Awake, no distress   Resp:  Normal effort  MSK:   Moves extremities without difficulty Other:  Regular rhythm, upper and lower extremity pulses symmetric bilaterally.  Medical Decision Making  Medically screening exam initiated at 1:38 PM.  Appropriate orders placed.  Jebediah Macrae was informed that the remainder of the evaluation will be completed by another provider, this initial triage assessment does not replace that evaluation, and the importance of remaining in the ED until their evaluation is complete.     Sherrill Raring, PA-C 09/14/22 1339

## 2024-03-28 NOTE — Progress Notes (Signed)
 03/29/2024 Nicholas Medina [id number] Sep 11, 1976  Referring provider: No ref. provider found Primary GI doctor: Dr. Bridgett Camps ( Dr. Sandrea Cruel)  ASSESSMENT AND PLAN:  Rectal bleeding with associated diarrhea, LLQ and right lower back pain will last 2-3 days up to 2-3 x a month Has BM 4-5 times a day, can wake him up at night No nausea, vomiting, no weight loss, fever, chills TSH low 06/2023 06/25/2022 colonoscopy with Dr. Bridgett Camps for concern for Crohn's good prep erythematous mucosa in distal ileum without erosion or ulceration, 5 mm polyp distal rectum, 4 mm polyp anus, moderate diverticulosis sigmoid, descending, hepatic and ascending colon no evidence of colitis.  Pathology showed ileal mucosa negative for acute inflammation chronicity or granulomas.  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 - recheck TSH with history of hyperthyroid in August - recheck ESR/CRP - recheck Cdiff - recheck CBC, CMET, celiac - Get CT AB and pelvis with contrast,pending results consider MRE versus VCE if any concern for small bowel or crohn's - add on fiber, IBGard, levsin given due to intolerance to dicyclomine  - consider IBS-D treatments with xifaxin - follow up 2 months  History of ileitis Remote imaging 2005 showed ileitis was on Pentasa for presumed diagnosis of Crohn's in 2005 CT abdomen pelvis with contrast 06/08/2022 findings concerning for severe ileitis infectious versus inflammatory, small volume ascites, colonic diverticulosis Colonoscopy 06/25/2022 negative for colitis, no evidence of Crohn's disease CRP 4.3 06/08/2022 Never completed fecal calprotectin, consider getting pending labs  Alcohol use and remote history  Alcohol use twice a week 5 airplane bottles Increasing marijuana use cocaine use rare Instructed to  quit alcohol, cocaine and smoking marijuana Handout given to the patient   Patient Care Team: Pcp, No as PCP - General  HISTORY OF PRESENT ILLNESS: 48 y.o. male with a past medical history listed below presents for evaluation of rectal bleeding.   Previously known to Dr. Sandrea Cruel last seen in the office by [facility] 06/17/2022.  Discussed the use of AI scribe software for clinical note transcription with the patient, who gave verbal consent to proceed.  History of Present Illness   Nicholas Medina is a 48 year old male who presents with recurrent episodes of diarrhea and rectal bleeding.  He has experienced gastrointestinal symptoms intermittently over the past two years. A CT scan in 2023 was abnormal, leading to a colonoscopy on June 25, 2022, which revealed diverticulosis and a benign polyp that was removed. Despite this, symptoms persist.  Approximately one and a half to two months ago, he experienced an episode of bright red blood during a bowel movement, accompanied by diarrhea. The stool was described as 'like diarrhea' with a fair amount of blood in the toilet, but no clots. He typically experiences diarrhea four to five times a day, which can wake him at night, and is often accompanied by left lower abdominal pain and back pain. The abdominal pain usually precedes the diarrhea, and the back pain resolves after a bowel movement.  No rectal pain, burning, or itching during these episodes. No nausea, vomiting, weight loss, fevers, or chills.  He has a history of hyperthyroidism, for which he was previously prescribed medication, but he cannot recall the name. He is not currently taking dicyclomine , as it did not alleviate his symptoms.  His social history includes  smoking cigarettes and consuming alcohol twice a week. He has a history of cocaine use but currently uses marijuana, which he is trying to reduce. He denies recent antibiotic use and reports a diet that includes fruits and  vegetables. He does not use Imodium or fiber supplements.        He  reports that he has been smoking cigarettes. He has never used smokeless tobacco. He reports current alcohol use. He reports current drug use. Drug: Marijuana.  RELEVANT GI HISTORY, IMAGING AND LABS: Results   LABS Thyroid  function: Hyperthyroid (06/2023)  DIAGNOSTIC Colonoscopy: Good prep, redness in distal ileum, no erosions, no ulcerations, 5 mm benign polyp removed, diverticulosis without inflammation, biopsies showed no acute inflammation, no granulomas (06/25/2022)      CBC    Component Value Date/Time   WBC 7.7 09/14/2022 1345   RBC 4.81 09/14/2022 1345   HGB 14.3 09/14/2022 1345   HCT 40.0 09/14/2022 1345   PLT 223 09/14/2022 1345   MCV 83.2 09/14/2022 1345   MCH 29.7 09/14/2022 1345   MCHC 35.8 09/14/2022 1345   RDW 13.2 09/14/2022 1345   LYMPHSABS 2.7 06/08/2022 0431   MONOABS 1.3 (H) 06/08/2022 0431   EOSABS 0.5 06/08/2022 0431   BASOSABS 0.0 06/08/2022 0431   No results for input(s): "HGB" in the last 8760 hours.  CMP     Component Value Date/Time   NA 138 09/14/2022 1345   K 3.7 09/14/2022 1345   CL 104 09/14/2022 1345   CO2 25 09/14/2022 1345   GLUCOSE 80 09/14/2022 1345   BUN 12 09/14/2022 1345   CREATININE 0.93 09/14/2022 1345   CALCIUM 8.7 (L) 09/14/2022 1345   PROT 7.8 06/08/2022 0431   ALBUMIN 3.9 06/08/2022 0431   AST 13 (L) 06/08/2022 0431   ALT 13 06/08/2022 0431   ALKPHOS 66 06/08/2022 0431   BILITOT 0.9 06/08/2022 0431   GFRNONAA >60 09/14/2022 1345   GFRAA >60 12/16/2018 2156      Latest Ref Rng & Units 06/08/2022    4:31 AM 05/18/2021    7:52 AM 04/12/2021    5:13 PM  Hepatic Function  Total Protein 6.5 - 8.1 g/dL 7.8  7.1  7.4   Albumin 3.5 - 5.0 g/dL 3.9  3.5  3.9   AST 15 - 41 U/L 13  28  22    ALT 0 - 44 U/L 13  25  24    Alk Phosphatase 38 - 126 U/L 66  58  57   Total Bilirubin 0.3 - 1.2 mg/dL 0.9  0.9  1.0       Current Medications:    Current  Outpatient Medications (Cardiovascular):    EPINEPHrine  0.3 mg/0.3 mL IJ SOAJ injection, Inject 0.3 mg into the muscle as needed for anaphylaxis.     Current Outpatient Medications (Other):    hyoscyamine (LEVSIN) 0.125 MG tablet, Take 1 tablet (0.125 mg total) by mouth every 6 (six) hours as needed for cramping.  Medical History:  Past Medical History:  Diagnosis Date   Seizures (HCC)    Allergies:  Allergies  Allergen Reactions   Penicillins Nausea And Vomiting    Has patient had a PCN reaction causing immediate rash, facial/tongue/throat swelling, SOB or lightheadedness with hypotension: No Has patient had a PCN reaction causing severe rash involving mucus membranes or skin necrosis: No Has patient had a PCN reaction that required hospitalization No Has patient had a PCN reaction occurring within the last 10 years: Yes - 2016  If all of the above answers are "NO", then may proceed with Cephalosporin use.      Surgical History:  He  has no past surgical history on file. Family History:  His Family history is unknown by patient.  REVIEW OF SYSTEMS  : All other systems reviewed and negative except where noted in the History of Present Illness.  PHYSICAL EXAM: BP 122/80   Pulse 75   Ht 6' (1.829 m)   Wt 182 lb (82.6 kg)   BMI 24.68 kg/m  Physical Exam   MEASUREMENTS: Weight- 182. GENERAL APPEARANCE: Well nourished, in no apparent distress. HEENT: No cervical lymphadenopathy, unremarkable thyroid , sclerae anicteric, conjunctiva pink. RESPIRATORY: Respiratory effort normal, breath sounds clear to auscultation bilaterally without rales, rhonchi, or wheezing. CARDIO: Regular rate and rhythm with no murmurs, rubs, or gallops, peripheral pulses intact. ABDOMEN: Soft, non-distended, active bowel sounds in all four quadrants, tenderness in left lower abdomen, no rebound, no mass appreciated. RECTAL: Declines. MUSCULOSKELETAL: Full range of motion, normal gait, without  edema. SKIN: Dry, intact without rashes or lesions. No jaundice. NEURO: Alert, oriented, no focal deficits. PSYCH: Cooperative, normal mood and affect.      Edmonia Gottron, PA-C 9:21 AM

## 2024-03-29 ENCOUNTER — Encounter: Payer: Self-pay | Admitting: Physician Assistant

## 2024-03-29 ENCOUNTER — Ambulatory Visit (INDEPENDENT_AMBULATORY_CARE_PROVIDER_SITE_OTHER): Admitting: Physician Assistant

## 2024-03-29 ENCOUNTER — Other Ambulatory Visit (INDEPENDENT_AMBULATORY_CARE_PROVIDER_SITE_OTHER)

## 2024-03-29 VITALS — BP 122/80 | HR 75 | Ht 72.0 in | Wt 182.0 lb

## 2024-03-29 DIAGNOSIS — F1090 Alcohol use, unspecified, uncomplicated: Secondary | ICD-10-CM

## 2024-03-29 DIAGNOSIS — R109 Unspecified abdominal pain: Secondary | ICD-10-CM | POA: Diagnosis not present

## 2024-03-29 DIAGNOSIS — R1084 Generalized abdominal pain: Secondary | ICD-10-CM

## 2024-03-29 DIAGNOSIS — F1010 Alcohol abuse, uncomplicated: Secondary | ICD-10-CM

## 2024-03-29 DIAGNOSIS — R1032 Left lower quadrant pain: Secondary | ICD-10-CM

## 2024-03-29 DIAGNOSIS — R197 Diarrhea, unspecified: Secondary | ICD-10-CM

## 2024-03-29 DIAGNOSIS — Z8719 Personal history of other diseases of the digestive system: Secondary | ICD-10-CM

## 2024-03-29 DIAGNOSIS — F1290 Cannabis use, unspecified, uncomplicated: Secondary | ICD-10-CM

## 2024-03-29 DIAGNOSIS — K625 Hemorrhage of anus and rectum: Secondary | ICD-10-CM | POA: Diagnosis not present

## 2024-03-29 DIAGNOSIS — F1410 Cocaine abuse, uncomplicated: Secondary | ICD-10-CM

## 2024-03-29 DIAGNOSIS — F1490 Cocaine use, unspecified, uncomplicated: Secondary | ICD-10-CM

## 2024-03-29 LAB — COMPREHENSIVE METABOLIC PANEL WITH GFR
ALT: 32 U/L (ref 0–53)
AST: 25 U/L (ref 0–37)
Albumin: 4.2 g/dL (ref 3.5–5.2)
Alkaline Phosphatase: 71 U/L (ref 39–117)
BUN: 9 mg/dL (ref 6–23)
CO2: 27 meq/L (ref 19–32)
Calcium: 9.2 mg/dL (ref 8.4–10.5)
Chloride: 103 meq/L (ref 96–112)
Creatinine, Ser: 0.86 mg/dL (ref 0.40–1.50)
GFR: 103.54 mL/min (ref >60.00)
Glucose, Bld: 103 mg/dL — ABNORMAL HIGH (ref 70–99)
Potassium: 3.9 meq/L (ref 3.5–5.1)
Sodium: 137 meq/L (ref 135–145)
Total Bilirubin: 0.6 mg/dL (ref 0.2–1.2)
Total Protein: 7.4 g/dL (ref 6.0–8.3)

## 2024-03-29 LAB — CBC WITH DIFFERENTIAL/PLATELET
Basophils Absolute: 0.1 10*3/uL (ref 0.0–0.1)
Basophils Relative: 0.8 % (ref 0.0–3.0)
Eosinophils Absolute: 0.2 10*3/uL (ref 0.0–0.7)
Eosinophils Relative: 3.1 % (ref 0.0–5.0)
HCT: 44.2 % (ref 39.0–52.0)
Hemoglobin: 15.2 g/dL (ref 13.0–17.0)
Lymphocytes Relative: 30.6 % (ref 12.0–46.0)
Lymphs Abs: 2.4 10*3/uL (ref 0.7–4.0)
MCHC: 34.5 g/dL (ref 30.0–36.0)
MCV: 87.1 fl (ref 78.0–100.0)
Monocytes Absolute: 0.7 10*3/uL (ref 0.1–1.0)
Monocytes Relative: 9.4 % (ref 3.0–12.0)
Neutro Abs: 4.5 10*3/uL (ref 1.4–7.7)
Neutrophils Relative %: 56.1 % (ref 43.0–77.0)
Platelets: 237 10*3/uL (ref 150.0–400.0)
RBC: 5.07 Mil/uL (ref 4.22–5.81)
RDW: 14.2 % (ref 11.5–15.5)
WBC: 8 10*3/uL (ref 4.0–10.5)

## 2024-03-29 LAB — TSH: TSH: 0.56 u[IU]/mL (ref 0.35–5.50)

## 2024-03-29 LAB — SEDIMENTATION RATE: Sed Rate: 11 mm/h (ref 0–15)

## 2024-03-29 LAB — C-REACTIVE PROTEIN: CRP: <1 mg/dL (ref 0.5–20.0)

## 2024-03-29 MED ORDER — HYOSCYAMINE SULFATE 0.125 MG PO TABS: 0.1250 mg | ORAL_TABLET | Freq: Four times a day (QID) | ORAL | 0 refills | Status: DC | PRN

## 2024-03-29 NOTE — Patient Instructions (Addendum)
 Your provider has requested that you go to the basement level for lab work before leaving today. Press "B" on the elevator. The lab is located at the first door on the left as you exit the elevator.   FIBER SUPPLEMENT You can do metamucil or fibercon once or twice a day but if this causes gas/bloating please switch to Benefiber or Citracel.  Fiber is good for constipation/diarrhea/irritable bowel syndrome.  It can also help with weight loss and can help lower your bad cholesterol (LDL).  Please do 1 TBSP in the morning in water , coffee, or tea.  It can take up to a month before you can see a difference with your bowel movements.  It is cheapest from [employer], [employer], [employer].   - Can try loperamide 4 mg initially, then 2 mg after each unformed stool for =2 days, with a maximum of 16 mg/day.  - If loperamide is not working, you could try bismuth salicylate (Pepto-Bismol) 30 mL or two tablets every 30 minutes for eight doses. Pepto-Bismol may make your stools black.   Go to the ER if any severe abdominal pain, fever, or weakness  First do a trial off milk/lactose products if you use them.  Add fiber like benefiber or citracel once a day Increase activity Can do trial of IBGard which is over the counter for AB pain- Take 1-2 capsules once a day for maintence or twice a day during a flare Can send in an anti spasm medication, Levsin, to take as needed Please try to decrease stress. consider talking with PCP about anti anxiety medication or try head space app for meditation. if any worsening symptoms like blood in stool, weight loss, please call the office     FODMAP stands for fermentable oligo-, di-, mono-saccharides and polyols (1). These are the scientific terms used to classify groups of carbs that are difficult for our body to digest and that are notorious for triggering digestive symptoms like bloating, gas, loose stools and stomach pain.   You can try low FODMAP diet  - start with  eliminating just one column at a time that you feel may be a trigger for you. - the table at the very bottom contains foods that are low in FODMAPs   Sometimes trying to eliminate the FODMAP's from your diet is difficult or tricky, if you are stuggling with trying to do the elimination diet you can try an enzyme.  There is a food enzymes that you sprinkle in or on your food that helps break down the FODMAP. You can read more about the enzyme by going to this site: https://fodzyme.com/    ___________________________  Cannabinoid Hyperemesis Syndrome  What is cannabinoid hyperemesis syndrome?  Cannabinoid hyperemesis syndrome (CHS) is a condition that leads to repeated and severe bouts of vomiting. It is rare and only occurs in daily long-term users of marijuana.  Marijuana has several active substances. These include THC and related chemicals. These substances bind to molecules found in the brain. That causes the drug "high" and other effects that users feel.  Your digestive tract also has a number of molecules that bind to [facility] and related substances. So marijuana also affects the digestive tract. For example, the drug can change the time it takes the stomach to empty. It also affects the esophageal sphincter. That's the tight band of muscle that opens and closes to let food from the esophagus into the stomach. Long-term marijuana use can change the way the affected molecules respond and  lead to the symptoms of CHS.  Marijuana is the most widely used recreational drug in the [place] Young adults are the most frequent users. A small number of these people develop CHS. It often only happens in people who have regularly used marijuana. Often CHS affects those who use the drug at least once a day.  What causes cannabinoid hyperemesis syndrome?  Marijuana has very complex effects on the body. Experts are still trying to learn exactly how it causes CHS in some people.  In the brain, marijuana  often has the opposite effect of CHS. It helps prevent nausea and vomiting. The drug is also good at stopping such symptoms in people having chemotherapy.  But in the digestive tract, marijuana seems to have the opposite effect. It actually makes you more likely to have nausea and vomiting. With the first use of marijuana, the signals from the brain may be more important. That may lead to anti-nausea effects at first. But with repeated use of marijuana, certain receptors in the brain may stop responding to the drug in the same way. That may cause the repeated bouts of vomiting found in people with CHS.  It still isn't clear why some heavy marijuana users get the syndrome, but others don't.  What are the symptoms of cannabinoid hyperemesis syndrome?  People with CHS suffer from repeated bouts of vomiting. In between these episodes are times without any symptoms. Healthcare providers often divide these symptoms into 3 stages: the prodromal phase, the hyperemetic phase, and the recovery phase.  Prodromal phase. During this phase, the main symptoms are often early morning nausea and belly (abdominal) pain. Some people also develop a fear of vomiting. Most people keep normal eating patterns during this time. Some people use more marijuana because they think it will help stop the nausea. This phase may last for months or years.  Hyperemetic phase. Symptoms during this time may include:  Ongoing nausea  Repeated episodes of vomiting  Belly pain  Decreased food intake and weight loss  Symptoms of fluid loss (dehydration)  During this phase, vomiting is often intense and overwhelming. Many people take a lot of hot showers during the day. They find that doing so eases their nausea. (That may be because of how the hot temperature affects a part of the brain called the hypothalamus. This part of the brain effects both temperature regulation and vomiting.) People often first seek medical care during this  phase.  The hyperemetic phase may continue until the person completely stops using marijuana. Then the recovery phase starts.  Recovery phase. During this time, symptoms go away. Normal eating is possible again. This phase can last days or months. Symptoms often come back if the person tries marijuana again.  How is cannabinoid hyperemesis syndrome diagnosed?  Many health problems can cause repeated vomiting. To make a diagnosis, your healthcare provider will ask you about your symptoms and your past health. He or she will also do a physical exam, including an exam of your belly. Often this diagnosis can be made from history alone, however.  Your healthcare provider may also need more tests to rule out other causes of the vomiting.  CHS was only recently discovered. So some healthcare providers may not know about it. As a result, they may not spot it for many years. They often confuse CHS with cyclical vomiting disorder. That is a health problem that causes similar symptoms. A specialist trained in diseases of the digestive tract (gastroenterologist) might make the diagnosis.  You may have CHS if you have all of these:  Long-term weekly and daily marijuana use  Belly pain  Severe, repeated nausea and vomiting  You feel better after taking a hot shower  There is no single test that confirms this diagnosis. Only improvement after quitting marijuana confirms the diagnosis.  How is cannabinoid hyperemesis syndrome treated?  To fully get better, you need to stop using marijuana all together. If you stop using marijuana, your symptoms should not come back.  Symptoms often ease after a day or 2 unless marijuana is used before this time.  In a small sample of people with CHS, rubbing capsaicin cream on the belly helped decrease pain and nausea. The chemicals in the cream have the same effect as a hot shower.  Quitting marijuana may lead to other health benefits, such as:  Better lung  function  Improved memory and thinking skills  Better sleep  Key points about cannabinoid hyperemesis syndrome  CHS is a condition that leads to repeated and severe bouts of vomiting. It results from long-term use of marijuana.  Most people self-treat using hot showers to help reduce their symptoms.  Some people with CHS may not be diagnosed for several years. Admitting to your healthcare provider that you use marijuana daily can speed up the diagnosis.  Symptoms start to go away within a day or 2 after stopping marijuana use.  Symptoms can come back if you use marijuana again  Due to recent changes in healthcare laws, you may see the results of your imaging and laboratory studies on MyChart before your provider has had a chance to review them.  We understand that in some cases there may be results that are confusing or concerning to you. Not all laboratory results come back in the same time frame and the provider may be waiting for multiple results in order to interpret others.  Please give us  48 hours in order for your provider to thoroughly review all the results before contacting the office for clarification of your results.   You will be contacted by [facility] Scheduling in the next 2 days to arrange a CT Abdominal Pelvis.  The number on your caller ID will be [phone], please answer when they call.  If you have not heard from them in 2 days please call [phone] to schedule.      I appreciate the  opportunity to care for you  Thank You   [facility]

## 2024-03-30 ENCOUNTER — Ambulatory Visit: Payer: Self-pay | Admitting: Physician Assistant

## 2024-03-30 LAB — IGA: Immunoglobulin A: 307 mg/dL (ref 47–310)

## 2024-03-30 LAB — TISSUE TRANSGLUTAMINASE, IGA: (tTG) Ab, IgA: <1 U/mL

## 2024-03-30 NOTE — Progress Notes (Signed)
 Addendum: Reviewed and agree with assessment and management plan. Asha Grumbine, Carie Caddy, MD

## 2024-04-14 ENCOUNTER — Ambulatory Visit (HOSPITAL_COMMUNITY)

## 2024-04-21 ENCOUNTER — Ambulatory Visit (HOSPITAL_COMMUNITY): Admission: RE | Admit: 2024-04-21 | Source: Ambulatory Visit

## 2024-05-07 ENCOUNTER — Ambulatory Visit (HOSPITAL_BASED_OUTPATIENT_CLINIC_OR_DEPARTMENT_OTHER)

## 2024-05-14 ENCOUNTER — Other Ambulatory Visit (HOSPITAL_BASED_OUTPATIENT_CLINIC_OR_DEPARTMENT_OTHER)

## 2024-06-04 ENCOUNTER — Ambulatory Visit (HOSPITAL_BASED_OUTPATIENT_CLINIC_OR_DEPARTMENT_OTHER)

## 2024-06-11 ENCOUNTER — Ambulatory Visit (HOSPITAL_BASED_OUTPATIENT_CLINIC_OR_DEPARTMENT_OTHER)
Admission: RE | Admit: 2024-06-11 | Discharge: 2024-06-11 | Disposition: A | Discharge status: Home / Self Care | Source: Ambulatory Visit | Attending: Physician Assistant | Admitting: Physician Assistant

## 2024-06-11 DIAGNOSIS — R109 Unspecified abdominal pain: Secondary | ICD-10-CM | POA: Diagnosis present

## 2024-06-11 DIAGNOSIS — R197 Diarrhea, unspecified: Secondary | ICD-10-CM | POA: Insufficient documentation

## 2024-06-11 DIAGNOSIS — R1084 Generalized abdominal pain: Secondary | ICD-10-CM | POA: Insufficient documentation

## 2024-06-11 LAB — POCT I-STAT CREATININE: Creatinine, Ser: 1 mg/dL (ref 0.61–1.24)

## 2024-06-11 MED ORDER — IOHEXOL 300 MG/ML  SOLN
100.0000 mL | Freq: Once | INTRAMUSCULAR | Status: AC | PRN
  Administered 2024-06-11: 100 mL via INTRAVENOUS

## 2024-06-22 ENCOUNTER — Ambulatory Visit (INDEPENDENT_AMBULATORY_CARE_PROVIDER_SITE_OTHER): Admitting: Podiatry

## 2024-06-22 ENCOUNTER — Encounter: Payer: Self-pay | Admitting: Podiatry

## 2024-06-22 DIAGNOSIS — D492 Neoplasm of unspecified behavior of bone, soft tissue, and skin: Secondary | ICD-10-CM | POA: Diagnosis not present

## 2024-06-22 DIAGNOSIS — M79672 Pain in left foot: Secondary | ICD-10-CM

## 2024-06-22 DIAGNOSIS — M79671 Pain in right foot: Secondary | ICD-10-CM | POA: Diagnosis not present

## 2024-06-22 NOTE — Progress Notes (Signed)
 Patient presents with a complaint of lesions on the plantar aspect of the feet bilaterally black spots in them.  They have been bothering him for several months now.  He had some pain around the first MTP in the fifth MTP bilaterally with certain shoes.   Physical exam:  General appearance: Pleasant, and in no acute distress. AOx3.  Vascular: Pedal pulses: DP 2/4 bilaterally, PT 2/4 bilaterally.  Mild edema lower legs bilaterally. Capillary fill time immediate bilaterally.  Neurological: Light touch intact feet bilaterally.  Normal Achilles reflex bilaterally.  No clonus or spasticity noted.   Dermatologic:   Verrucous lesion with skin lines going around the lesion and pinpoint hemorrhage.  His plantar fifth metatarsal head the left.  Hyperkeratotic lesions hallux IPJ right and subfifth MTP right.  Skin normal temperature bilaterally.  Skin normal color, tone, and texture bilaterally.   Musculoskeletal: Hallux abductovalgus deformities bilaterally.  Tailor's bunion deformities bilaterally.  Tenderness on the plantar and lateral aspect of the fifth MTP    Diagnosis: 1 pain feet bilaterally 2.  Verrucous benign neoplasm plantar left foot.  Plan: -New patient office visit for evaluation and management level 3.. - Discussed with him the probable verrucous lesion on the left foot.  Discussed treatment methods that we can use for this.  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 -Applied Salinocaine compound to lesion(s) as noted in physical exam after debriding lesions to pinpoint bleeding.  Salinocaine applied to lesion(s) and covered with an occlusive dressing with Coban wrap.  Written and oral instructions given to patient. .    Return 2 weeks follow-up verruca left foot and radiographs feet bilaterally to evaluate tailor's bunion and

## 2024-07-06 ENCOUNTER — Ambulatory Visit: Admitting: Internal Medicine

## 2024-09-04 NOTE — Progress Notes (Signed)
 "     Ellouise Console, PA-C [address] [place], [place]  [address] Phone: [phone]   Primary Care Physician: [facility], [place]  Primary Gastroenterologist:  Ellouise Console, PA-C / Dr. Gordy Starch   Chief Complaint: Follow-up abdominal pain and diarrhea       HPI:   Discussed the use of AI scribe software for clinical note transcription with the patient, who gave verbal consent to proceed. History of Present Illness Nicholas Medina is a 48 year old male who presents for follow-up of chronic intermittent lower abdominal cramping and diarrhea ongoing for 20 years.  Started when he was a teenager.  The abdominal pain is sharp, located in the lower abdomen, and occasionally radiates to the lower back. It occurs intermittently, about two to three times a month, lasting several hours to half a day, and can be severe enough to prevent him from getting out of bed. Certain drinks like soda and juice exacerbate his symptoms, leading to increased bowel movements and diarrhea.  He has up to four bowel movements a day, which are typically watery and tacky, requiring him to clean the toilet. He has not had solid stools and sees blood in his stool about once a month, a decrease from previous months when it was more frequent.  He has tried various medications, including hyoscyamine , which made him feel uncomfortable, and a Prevpac regimen during his teenage years, which seemed effective at the time. He is currently taking hyoscyamine  for his symptoms, which is not helping. No recent weight loss. He confirms having his gallbladder.  06/25/2022 colonoscopy with Dr. Starch for concern for Crohn's:  good prep erythematous mucosa in distal ileum without erosion or ulceration, 5 mm hyperplastic polyp distal rectum, 4 mm hyperplastic polyp anus, moderate diverticulosis sigmoid, descending, hepatic and ascending colon no evidence of colitis.  Pathology showed ileal mucosa negative for acute  inflammation chronicity or granulomas.  10 year repeat (06/2032).   06/11/2024 CT abdomen pelvis with contrast: 1. No acute inflammatory process identified within the abdomen or pelvis. 2. There are scattered diverticula in the left hemi colon without diverticulitis. 3. Multiple other nonacute observations, as described above.  03/29/2024 labs: Celiac panel negative.  Normal CRP and sed rate.  Normal CBC, CMP, and TSH.  C. difficile was ordered, but not completed.  PMH: History of alcohol abuse, cocaine abuse, and marijuana use.  Current Outpatient Medications  Medication Sig Dispense Refill   EPINEPHrine  0.3 mg/0.3 mL IJ SOAJ injection Inject 0.3 mg into the muscle as needed for anaphylaxis. (Patient not taking: Reported on 09/05/2024) 1 each 0   glycopyrrolate  (ROBINUL ) 2 MG tablet Take 1 tablet (2 mg total) by mouth 2 (two) times daily. 60 tablet 5   No current facility-administered medications for this visit.    Allergies as of 09/05/2024 - Review Complete 09/05/2024  Allergen Reaction Noted   Penicillins Nausea And Vomiting 10/08/2015    Past Medical History:  Diagnosis Date   Seizures (HCC)     History reviewed. No pertinent surgical history.  Review of Systems:    All systems reviewed and negative except where noted in HPI.    Physical Exam:  BP 122/80   Pulse 68   Ht 6' (1.829 m)   Wt 186 lb (84.4 kg)   BMI 25.23 kg/m  No LMP for male patient.  General: Well-nourished, well-developed in no acute distress.  Lungs: Clear to auscultation bilaterally. Non-labored. Heart: Regular rate and rhythm, no murmurs  rubs or gallops.  Abdomen: Bowel sounds are normal; Abdomen is Soft; No hepatosplenomegaly, masses or hernias;  No Abdominal Tenderness; No guarding or rebound tenderness. Neuro: Alert and oriented x 3.  Grossly intact.  Psych: Alert and cooperative, normal mood and affect.   Imaging Studies: No results found.  Labs: CBC    Component Value Date/Time   WBC  8.0 03/29/2024 0857   RBC 5.07 03/29/2024 0857   HGB 15.2 03/29/2024 0857   HCT 44.2 03/29/2024 0857   PLT 237.0 03/29/2024 0857   MCV 87.1 03/29/2024 0857   MCH 29.7 09/14/2022 1345   MCHC 34.5 03/29/2024 0857   RDW 14.2 03/29/2024 0857   LYMPHSABS 2.4 03/29/2024 0857   MONOABS 0.7 03/29/2024 0857   EOSABS 0.2 03/29/2024 0857   BASOSABS 0.1 03/29/2024 0857    CMP     Component Value Date/Time   NA 137 03/29/2024 0857   K 3.9 03/29/2024 0857   CL 103 03/29/2024 0857   CO2 27 03/29/2024 0857   GLUCOSE 103 (H) 03/29/2024 0857   BUN 9 03/29/2024 0857   CREATININE 1.00 06/11/2024 0827   CALCIUM 9.2 03/29/2024 0857   PROT 7.4 03/29/2024 0857   ALBUMIN 4.2 03/29/2024 0857   AST 25 03/29/2024 0857   ALT 32 03/29/2024 0857   ALKPHOS 71 03/29/2024 0857   BILITOT 0.6 03/29/2024 0857   GFRNONAA >60 09/14/2022 1345   GFRAA >60 12/16/2018 2156     Assessment and Plan:   Ryatt Corsino is a 48 y.o. y/o male returns for follow-up of:  1.  Diarrhea: Celiac labs were negative.  Colonoscopy and abdominal pelvic CT did not confirm inflammatory bowel disease, however there was some inflammation at the terminal ileum.  Differential includes IBS-D, EPI, infectious diarrhea, IBD, alpha gal, food allergies.  I am most suspicious for irritable bowel syndrome given the chronic long-term intermittent nature of his symptoms. - Stool Studies: GI Pathogen Panal, Fecal Calprotectin, fecal pancreatic elastase. - Labs: alpha gal and food allergy  panel. - Stop hyoscyamine  - Start glycopyrrolate  2 mg tablet twice daily, #60, 5 refills. - Consider trial of Xifaxan for IBS-D if above testing is normal.  2.  Generalized abdominal pain: Recent CT abdomen pelvis showed no acute abnormality.  Suspect irritable bowel syndrome with diarrhea.  3.   Diverticulosis of colon without diverticulitis Diverticulosis noted on CT abdomen and pelvis without signs of diverticulitis or acute inflammation.  Assessment and  Plan    Ellouise Console, PA-C  Follow up in 6 to 8 weeks with TG.  Also follow-up based on above test results.   "

## 2024-09-05 ENCOUNTER — Ambulatory Visit: Admitting: Physician Assistant

## 2024-09-05 ENCOUNTER — Encounter: Payer: Self-pay | Admitting: Physician Assistant

## 2024-09-05 ENCOUNTER — Other Ambulatory Visit (INDEPENDENT_AMBULATORY_CARE_PROVIDER_SITE_OTHER)

## 2024-09-05 ENCOUNTER — Telehealth: Payer: Self-pay | Admitting: Physician Assistant

## 2024-09-05 VITALS — BP 122/80 | HR 68 | Ht 72.0 in | Wt 186.0 lb

## 2024-09-05 DIAGNOSIS — R109 Unspecified abdominal pain: Secondary | ICD-10-CM

## 2024-09-05 DIAGNOSIS — K580 Irritable bowel syndrome with diarrhea: Secondary | ICD-10-CM | POA: Diagnosis not present

## 2024-09-05 DIAGNOSIS — R197 Diarrhea, unspecified: Secondary | ICD-10-CM | POA: Diagnosis not present

## 2024-09-05 MED ORDER — GLYCOPYRROLATE 2 MG PO TABS: 2.0000 mg | ORAL_TABLET | Freq: Two times a day (BID) | ORAL | 5 refills | Status: DC

## 2024-09-05 MED ORDER — GLYCOPYRROLATE 2 MG PO TABS: 2.0000 mg | ORAL_TABLET | Freq: Two times a day (BID) | ORAL | 5 refills | Status: AC

## 2024-09-05 NOTE — Telephone Encounter (Signed)
 Patient requesting prescription be sent to [facility] on [address] .

## 2024-09-05 NOTE — Patient Instructions (Addendum)
 Your provider has requested that you go to the basement level for lab work before leaving today. Press B on the elevator. The lab is located at the first door on the left as you exit the elevator.  We have sent the following medications to your pharmacy for you to pick up at your convenience: Glycopyrrolate 2 mg twice daily  Please follow up sooner if symptoms increase or worsen  Due to recent changes in healthcare laws, you may see the results of your imaging and laboratory studies on MyChart before your provider has had a chance to review them.  We understand that in some cases there may be results that are confusing or concerning to you. Not all laboratory results come back in the same time frame and the provider may be waiting for multiple results in order to interpret others.  Please give us  48 hours in order for your provider to thoroughly review all the results before contacting the office for clarification of your results.   Thank you for trusting me with your gastrointestinal care!   Ellouise Console, PA-C _______________________________________________________  If your blood pressure at your visit was 140/90 or greater, please contact your primary care physician to follow up on this.  _______________________________________________________  If you are age 72 or older, your body mass index should be between 23-30. Your Body mass index is 25.23 kg/m. If this is out of the aforementioned range listed, please consider follow up with your Primary Care Provider.  If you are age 17 or younger, your body mass index should be between 19-25. Your Body mass index is 25.23 kg/m. If this is out of the aformentioned range listed, please consider follow up with your Primary Care Provider.   ________________________________________________________  The [facility] providers would like to encourage you to use MYCHART to communicate with providers for non-urgent requests or questions.  Due to long hold  times on the telephone, sending your provider a message by [facility] may be a faster and more efficient way to get a response.  Please allow 48 business hours for a response.  Please remember that this is for non-urgent requests.  _______________________________________________________

## 2024-09-07 ENCOUNTER — Ambulatory Visit: Payer: Self-pay | Admitting: Physician Assistant

## 2024-09-08 LAB — FOOD ALLERGY PROFILE
Allergen, Salmon, f41: <0.1 kU/L
Almonds: <0.1 kU/L
Brazil Nut: <0.1 kU/L
CLASS: 0
CLASS: 0
CLASS: 0
CLASS: 0
CLASS: 0
CLASS: 0
CLASS: 0
CLASS: 0
CLASS: 0
CLASS: 0
CLASS: 2
Cashew IgE: <0.1 kU/L
Class: 0
Class: 0
Class: 0
Class: 0
Egg White IgE: <0.1 kU/L
Fish Cod: <0.1 kU/L
Hazelnut: <0.1 kU/L
Macadamia Nut: <0.1 kU/L
Milk IgE: <0.1 kU/L
Peanut IgE: <0.1 kU/L
Scallop IgE: <0.1 kU/L
Sesame Seed f10: 0.11 kU/L — ABNORMAL HIGH
Shrimp IgE: 0.18 kU/L — ABNORMAL HIGH
Soybean IgE: <0.1 kU/L
Tuna IgE: <0.1 kU/L
Walnut: <0.1 kU/L
Wheat IgE: 0.86 kU/L — ABNORMAL HIGH

## 2024-09-08 LAB — ALPHA-GAL PANEL
Allergen, Mutton, f88: <0.1 kU/L
Allergen, Pork, f26: <0.1 kU/L
Beef: <0.1 kU/L
CLASS: 0
CLASS: 0
Class: 0
GALACTOSE-ALPHA-1,3-GALACTOSE IGE*: <0.1 kU/L (ref <0.10)

## 2024-09-08 LAB — INTERPRETATION:

## 2024-10-19 NOTE — Progress Notes (Deleted)
 Ellouise Console, PA-C [address] [place], [place]  [address] Phone: [phone]   Primary Care Physician: [facility], [place]  Primary Gastroenterologist:  Ellouise Console, PA-C / Dr. Gordy Starch   Chief Complaint: Follow-up chronic diarrhea       HPI:   Discussed the use of AI scribe software for clinical note transcription with the patient, who gave verbal consent to proceed.  48 year old male returns for 6-week follow-up of chronic diarrhea and abdominal cramping thought due to irritable bowel syndrome, diarrhea predominant.  Ongoing diarrhea for 20 years, started as a teenager.  6 weeks ago he was switched from hyoscyamine  to glycopyrrolate  2 mg twice daily.  Labs: Negative alpha gal.  Negative celiac.  Normal TSH, CBC, CMP.  Allergy  panel showed slight increased allergy  to wheat and increased sensitivity to shrimp and sesame seed.  All other food allergens negative.  Pancreatic elastase, GI pathogen, and fecal calprotectin ordered but not completed.  He has tried various medications, including hyoscyamine , which made him feel uncomfortable, and a Prevpac regimen during his teenage years, which seemed effective at the time. He is currently taking hyoscyamine  for his symptoms, which is not helping. No recent weight loss. He confirms having his gallbladder.   06/25/2022 colonoscopy with Dr. Starch: Erythematous mucosa in distal ileum (biopsies negative for Crohn's).  2 small (5 mm, 4 mm) hyperplastic polyps removed.  No evidence of colitis.  Pandiverticulosis.  Biopsies negative for IBD.10 year repeat (06/2032).    06/11/2024 CT abdomen pelvis with contrast: 1. No acute inflammatory process identified within the abdomen or pelvis. 2. There are scattered diverticula in the left hemi colon without diverticulitis. 3. Multiple other nonacute observations, as described above.    History of Present Illness      Current Outpatient Medications  Medication Sig Dispense  Refill   EPINEPHrine  0.3 mg/0.3 mL IJ SOAJ injection Inject 0.3 mg into the muscle as needed for anaphylaxis. (Patient not taking: Reported on 09/05/2024) 1 each 0   glycopyrrolate  (ROBINUL ) 2 MG tablet Take 1 tablet (2 mg total) by mouth 2 (two) times daily. 60 tablet 5   No current facility-administered medications for this visit.    Allergies as of 10/20/2024 - Review Complete 09/05/2024  Allergen Reaction Noted   Penicillins Nausea And Vomiting 10/08/2015    Past Medical History:  Diagnosis Date   Seizures (HCC)     No past surgical history on file.  Review of Systems:    All systems reviewed and negative except where noted in HPI.    Physical Exam:  There were no vitals taken for this visit. No LMP for male patient.  General: Well-nourished, well-developed in no acute distress.  Lungs: Clear to auscultation bilaterally. Non-labored. Heart: Regular rate and rhythm, no murmurs rubs or gallops.  Abdomen: Bowel sounds are normal; Abdomen is Soft; No hepatosplenomegaly, masses or hernias;  No Abdominal Tenderness; No guarding or rebound tenderness. Neuro: Alert and oriented x 3.  Grossly intact.  Psych: Alert and cooperative, normal mood and affect.   Imaging Studies: No results found.  Labs: CBC    Component Value Date/Time   WBC 8.0 03/29/2024 0857   RBC 5.07 03/29/2024 0857   HGB 15.2 03/29/2024 0857   HCT 44.2 03/29/2024 0857   PLT 237.0 03/29/2024 0857   MCV 87.1 03/29/2024 0857   MCH 29.7 09/14/2022 1345   MCHC 34.5 03/29/2024 0857   RDW 14.2 03/29/2024 0857   LYMPHSABS 2.4 03/29/2024  0857   MONOABS 0.7 03/29/2024 0857   EOSABS 0.2 03/29/2024 0857   BASOSABS 0.1 03/29/2024 0857    CMP     Component Value Date/Time   NA 137 03/29/2024 0857   K 3.9 03/29/2024 0857   CL 103 03/29/2024 0857   CO2 27 03/29/2024 0857   GLUCOSE 103 (H) 03/29/2024 0857   BUN 9 03/29/2024 0857   CREATININE 1.00 06/11/2024 0827   CALCIUM 9.2 03/29/2024 0857   PROT 7.4  03/29/2024 0857   ALBUMIN 4.2 03/29/2024 0857   AST 25 03/29/2024 0857   ALT 32 03/29/2024 0857   ALKPHOS 71 03/29/2024 0857   BILITOT 0.6 03/29/2024 0857   GFRNONAA >60 09/14/2022 1345   GFRAA >60 12/16/2018 2156       Assessment and Plan:   Nicholas Medina is a 48 y.o. y/o male returns for follow-up of  1.  Chronic diarrhea with abdominal cramping for 20 years.  Most consistent with irritable bowel syndrome.  Previous workup negative for celiac, IBD, alpha gal.  Mild increased allergy  to wheat.  -Continue treatment for IBS-daily - Continue glycopyrrolate  - Xifaxan - Avoid wheat - Avoid alcohol - Low FODMAP diet - Ibgard - Imodium - Colestid, FiberCon  Assessment and Plan Assessment & Plan       Ellouise Console, PA-C  Follow up ***

## 2024-10-20 ENCOUNTER — Ambulatory Visit: Admitting: Physician Assistant
